# Patient Record
Sex: Female | Born: 1985 | Race: Black or African American | Hispanic: No | Marital: Single
Health system: Midwestern US, Community
[De-identification: ages and names within clinical notes are randomized; demographics above are authoritative.]

## PROBLEM LIST (undated history)

## (undated) DIAGNOSIS — F319 Bipolar disorder, unspecified: Secondary | ICD-10-CM

## (undated) DIAGNOSIS — I10 Essential (primary) hypertension: Secondary | ICD-10-CM

## (undated) DIAGNOSIS — O09299 Supervision of pregnancy with other poor reproductive or obstetric history, unspecified trimester: Secondary | ICD-10-CM

## (undated) DIAGNOSIS — Z3009 Encounter for other general counseling and advice on contraception: Secondary | ICD-10-CM

## (undated) DIAGNOSIS — 1 ERRONEOUS ENCOUNTER ICD10: Secondary | ICD-10-CM

## (undated) DIAGNOSIS — N926 Irregular menstruation, unspecified: Secondary | ICD-10-CM

## (undated) DIAGNOSIS — O0990 Supervision of high risk pregnancy, unspecified, unspecified trimester: Secondary | ICD-10-CM

## (undated) HISTORY — DX: Essential (primary) hypertension: I10

## (undated) HISTORY — DX: Supervision of pregnancy with other poor reproductive or obstetric history, unspecified trimester: O09.299

## (undated) HISTORY — DX: Bipolar disorder, unspecified: F31.9

## (undated) HISTORY — PX: WISDOM TOOTH EXTRACTION: SHX21

---
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
## 2010-03-29 LAB — DRUGS OF ABUSE SCR, URINE
Amphetamine Screen, Ur: NEGATIVE
Barbiturate Screen, Ur: NEGATIVE
Benzodiazepines: NEGATIVE
Cannabinoid Scrn, Ur: NEGATIVE
Cocaine Metabolite, Urine: NEGATIVE
Methadone Screen, Urine: NEGATIVE
Opiates, Urine: NEGATIVE
Phencyclidine, Urine: NEGATIVE
Propoxyphene, Urine: NEGATIVE

## 2010-03-29 LAB — MICROSCOPIC URINALYSIS
RBC, UA: 2 /HPF (ref 0–2)
WBC, UA: 10 /HPF (ref 0–5)

## 2010-03-29 LAB — URINALYSIS
Bilirubin, Urine: NEGATIVE
Glucose, UA: NEGATIVE
Ketones, Urine: NEGATIVE
Nitrite, Urine: NEGATIVE
Protein, UA: NEGATIVE
Specific Gravity, UA: 1.012 (ref 1.005–1.030)
Urobilinogen, Urine: NORMAL
pH, UA: 6.5 (ref 5.0–8.0)

## 2010-03-30 LAB — C. TRACHOMATIS / N. GONORRHOEAE, DNA

## 2010-03-30 LAB — CBC WITH DIFFERENTIAL
Absolute Baso #: 0 10*3/uL (ref 0.0–0.2)
Absolute Eos #: 0 10*3/uL (ref 0.0–0.4)
Absolute Lymph #: 1.4 10*3/uL (ref 1.0–4.8)
Absolute Mono #: 0.5 10*3/uL (ref 0.1–1.2)
Absolute Neut #: 8.2 10*3/uL — ABNORMAL HIGH (ref 1.8–7.7)
Basophils: 0 % (ref 0–2)
Eosinophils %: 0 % — ABNORMAL LOW (ref 1–4)
Hematocrit: 34.4 % — ABNORMAL LOW (ref 36–46)
Hemoglobin: 11.8 g/dL — ABNORMAL LOW (ref 12.0–16.0)
Lymphocytes: 14 % — ABNORMAL LOW (ref 24–44)
MCH: 31 pg (ref 26–34)
MCHC: 34.1 g/dL (ref 31–37)
MCV: 90.7 fL (ref 80–100)
MPV: 9.7 fL (ref 6.0–12.0)
Monocytes: 5 % (ref 2–11)
Platelet Count: 259 10*3/uL (ref 140–450)
RBC: 3.8 m/uL — ABNORMAL LOW (ref 4.0–5.2)
RDW: 15 % (ref 12.5–15.4)
Seg Neutrophils: 81 % — ABNORMAL HIGH (ref 36–66)
WBC: 10.1 10*3/uL (ref 3.5–11.0)

## 2010-03-30 LAB — URINE CULTURE CLEAN CATCH

## 2010-04-05 LAB — SURGICAL PATHOLOGY

## 2010-08-25 LAB — POCT URINE PREGNANCY

## 2010-08-25 MED ORDER — MYNATE 90 PLUS PO TBCR
ORAL_TABLET | Freq: Every day | ORAL | Status: DC
Start: 2010-08-25 — End: 2011-04-20

## 2010-08-25 MED ORDER — ONDANSETRON HCL 4 MG PO TABS
4 MG | ORAL_TABLET | Freq: Three times a day (TID) | ORAL | Status: DC | PRN
Start: 2010-08-25 — End: 2011-04-20

## 2010-08-25 NOTE — Progress Notes (Signed)
Brittney [name]  08/25/2010   Patient's last menstrual period was 06/27/2010.  [redacted]w[redacted]d      CC: Initial Prenatal Visit    Subjective:     Brittney [name] is being seen today for her first obstetrical visit.  This is not a planned pregnancy. She is at [redacted]w[redacted]d  Her obstetrical history is significant for severe depression, history of early preterm delivery, and smokes.      Relationship with FOB: significant other, not living together. Patient does not intend to breast feed. Pregnancy history fully reviewed.  Mother's ethnicity: African American  Father's ethnicity: unknown      Objective:   Blood pressure 110/64, height 5\' 4"  (1.626 m), weight 114 lb (51.71 kg), last menstrual period 06/27/2010.    Past Medical History   Diagnosis Date   ??? History of trichomoniasis 03/29/10   ??? 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 Depression      BIPOLAR, SEVERE DEPRESSION ANXIETY   ??? Mild preeclampsia delivered 05,     ELEVATED BP IN EVERY PREGNANCY     History reviewed. No pertinent past surgical history.  History   Smoking status   ??? Current Everyday Smoker -- 0.5 packs/day for 7 years   Smokeless tobacco   ??? Never Used     History   Alcohol Use No     Current Outpatient Prescriptions   Medication Sig Dispense Refill   ??? Prenatal Vit-DSS-Fe Fum-FA (MYNATE 90 PLUS) TBCR Take 1 tablet by mouth daily.  90 tablet  4   ??? ondansetron (ZOFRAN) 4 MG tablet Take 1 tablet by mouth every 8 hours as needed for Nausea.  30 tablet  1       ALLERGIES:  Review of patient's allergies indicates no known allergies.    Physical Exam Completed-See Epic Navigator           Assessment:      Pregnancy at 8 and 3/7 weeks   Patient Active Problem List   Diagnoses   ??? History of trichomoniasis   ??? Varicosities   ??? Depression   ??? Mild preeclampsia delivered          Plan:      Initial labs drawn.  Prenatal vitamins.  Problem list reviewed and updated.  NT Screen/Quad Testing and MSAFP Single Marker: requested  Role of ultrasound in pregnancy  discussed; fetal survey: requests  Amniocentesis discussed: Undecided  Cultures & Wet Prep Collected  Follow-up in 4 weeks        COUNSELING COMPLETED:  INITIAL OBSTETRICAL VISIT EVALUATION:  The patient was seen full history and physical was completed/reviewed. Cytology was collected for patients over 98 years of age.Cultures were collected. The patient was counseled on office policies and she was counseled on termination of pregnancy in the state of [place].     The patient was counseled on Toxoplasmosis, HIV, Tobacco Abuse, Group Beta Strep Infections, Cystic Fibrosis, Preterm Labor precautions and Sickle Cell disease.    The patient was counseled on the risks of tobacco abuse. Both maternal and fetal. She was instructed to stop smoking if currently using tobacco. Morbidity, mortality, and cessation programs were reviewed. The risks include but are not limited to increased risks of preterm labor, preterm delivery, premature rupture of membranes, intrauterine growth restriction, intrauterine fetal demise and abruptio placenta. Secondary smoke risks were also reviewed. Increases in cancer, respiratory problems, and sudden infant death syndrome were reviewed as well.  The patient was informed of a 2-4% risk of congenital anomalies in the general population. She was also informed that karyotyping is the only way to evaluate the fetus for genetic problems and genetic lethal anomalies. Chorionic villous sampling and amniocentesis were also discussed with morbidity rates in detail. She undecided the procedure.    Route of delivery and counseling on vaginal, operative vaginal, and cesarean sections were completed with the risks of each to both the patient as well as her baby. The possibility of a blood transfusion was discussed as well. The patient was not opposed to receiving a transfusion if needed.    Nuchal translucency/Quad Evaluation and MSAFP single marker testing was reviewed in detail with attention to timing of  testing and their windows. For patients beyond the gestational age for Nuchal translucency evaluation Quad testing was recommended. Timing for the Quad test was reviewed. Benefits of the above testing was reviewed. A second trimester amniocentesis was also made available to the patient. Risks, Benefits and non-invasive alternative testing was reviewed.     The patient was questioned in detail regarding any genetic misnomer history, chromosomal abnormalities, or learning disabilities in  herself, the father of the baby or their families. SHE DENIED ANY HISTORY AS STATED ABOVE: Yes    Upon completion of the visit all questions were answered and the patients follow-up and testing schedule were reviewed.  Prenatal vitamins were given.  Referred to MFM for consult ASAP

## 2010-09-09 ENCOUNTER — Ambulatory Visit: Admit: 2010-09-09 | Disposition: A | Discharge status: Home / Self Care | Attending: Psychiatry | Admitting: Psychiatry

## 2010-09-10 LAB — CBC WITH DIFFERENTIAL
Absolute Eos #: 0.1 10*3/uL (ref 0.0–0.4)
Absolute Lymph #: 2.5 10*3/uL (ref 1.0–4.8)
Absolute Mono #: 0.4 10*3/uL (ref 0.1–1.2)
Absolute Neut #: 3.3 10*3/uL (ref 1.8–7.7)
Basophils Absolute: 0.1 10*3/uL (ref 0.0–0.2)
Basophils: 2 % (ref 0–2)
Eosinophils %: 1 % (ref 1–4)
Hematocrit: 37 % (ref 36–46)
Hemoglobin: 12.9 g/dL (ref 12.0–16.0)
Lymphocytes: 39 % (ref 24–44)
MCH: 29.9 pg (ref 26–34)
MCHC: 34.7 g/dL (ref 31–37)
MCV: 86.2 fL (ref 80–100)
MPV: 9.1 fL (ref 6.0–12.0)
Monocytes: 6 % (ref 2–11)
Platelets: 270 10*3/uL (ref 140–450)
RBC: 4.3 m/uL (ref 4.0–5.2)
RDW: 17.1 % — ABNORMAL HIGH (ref 12.5–15.4)
Seg Neutrophils: 52 % (ref 36–66)
WBC: 6.3 10*3/uL (ref 3.5–11.0)

## 2010-09-10 LAB — BASIC METABOLIC PANEL
Anion Gap: 9 mmol/L (ref 8–16)
BUN: 8 mg/dL (ref 6–20)
CO2: 24 mmol/L (ref 20–31)
Calcium: 9.9 mg/dL (ref 8.6–10.4)
Chloride: 105 mmol/L (ref 98–110)
Creatinine: 0.58 mg/dL (ref 0.4–1.0)
GFR African American: >60 mL/min (ref >60)
GFR Non-African American: >60 mL/min (ref >60)
Glucose: 83 mg/dL (ref 74–106)
Potassium: 3.8 mmol/L (ref 3.5–5.1)
Sodium: 134 mmol/L — ABNORMAL LOW (ref 136–145)

## 2010-09-10 LAB — HCG, SERUM, QUALITATIVE: hCG Qual: POSITIVE — AB

## 2010-10-12 MED ORDER — METRONIDAZOLE 500 MG PO TABS
500 MG | ORAL_TABLET | Freq: Two times a day (BID) | ORAL | Status: AC
Start: 2010-10-12 — End: 2010-10-22

## 2010-10-12 NOTE — Progress Notes (Signed)
Blood pressure 102/58, weight 116 lb (52.617 kg), last menstrual period 06/27/2010, unknown if currently breastfeeding.      The patient was seen and evaluated. There was Positive fetal movements. No contractions or leakage of fluid. Signs and symptoms of preterm labor as well as labor were reviewed.The Nuchal Translucency testing was reviewed and found to be normal. A single marker MSAFP was ordered for a 15-[redacted] week gestational age window. TOP ST OH Reviewed. Dates were reviewed with the patient.  An 18-22 week anatomy ultrasound has been ordered.  The patient will return to the office for her next visit in 4 weeks. See antepartum flow sheet. Encouraged pt. To get all prenatal lab work and keep appointments. Pelvic rest. Flagyl ordered for BV encouraged pt. To pick up RX and take all of medication.

## 2010-10-12 NOTE — Assessment & Plan Note (Signed)
Starts 17-P next week  MFM appointment next week  MFM appointment 11/2010

## 2010-10-12 NOTE — Patient Instructions (Signed)
Please reprint prenatal labs for pt. To get today

## 2010-11-15 MED ORDER — CEPHALEXIN 500 MG PO CAPS
500 MG | ORAL_CAPSULE | Freq: Four times a day (QID) | ORAL | Status: AC
Start: 2010-11-15 — End: 2010-11-25

## 2010-11-15 NOTE — Progress Notes (Signed)
Blood pressure 108/64, weight 122 lb (55.339 kg), last menstrual period 06/27/2010, unknown if currently breastfeeding.    The patient was seen and evaluated. There was positive fetal movements. No contractions or leakage of fluid. Signs and symptoms of preterm labor as well as labor were reviewed.The Nuchal Translucency testing was reviewed and found to be normal A single marker MSAFP was reviewed and found to be pt did not get done. The patients anatomy ultrasound has been completed and reviewed with patient. TOP ST OH Reviewed. A 28 week lab panel was ordered. This includes a (CBC, 1 hr GTT, U/A C&S). The patient is to complete this in the next two to four weeks.    The S/S of Pre-Eclampsia were reviewed with the patient in detail. She is to report any of these if they occur. She currently denies any of these.    The patient is RH pt did not get done  Rhogam Ordered no    The patient was instructed on fetal kick counts and was given a kick sheet to complete every 8 hours. This is to begin at [redacted] weeks gestation. She was instructed that the baby should move at a minimum of ten times within one hour after a meal. The patient was instructed to lay down on her left side twenty minutes after eating and count movements for up to one hour with a target value of ten movements.  She was instructed to notify the office if she did not make that target after two attempts or if after any attempt there was less than four movements.      The patient will return to the office for her next visit in 3 weeks. See antepartum flow sheet.   D/W pt importance of getting labs done & keeping scheduled appointments. Pt given Rx for keflex for clinical UTI. Specimen obtained. Pt to keep appointments- counseled. Pt getting weekly 17 OH progesterone injections

## 2010-11-15 NOTE — Progress Notes (Signed)
Addended by: Oval Linsey on: 11/15/2010 10:24 AM     Modules accepted: Orders

## 2010-12-06 MED ORDER — FAMOTIDINE 20 MG PO TABS
20 MG | ORAL_TABLET | Freq: Every day | ORAL | Status: DC
Start: 2010-12-06 — End: 2011-02-01

## 2010-12-06 NOTE — Progress Notes (Signed)
Addended by: Oval Linsey on: 12/06/2010 12:37 PM     Modules accepted: Orders

## 2010-12-06 NOTE — Progress Notes (Signed)
Addended by: Oval Linsey on: 12/06/2010 01:37 PM     Modules accepted: Orders

## 2010-12-06 NOTE — Progress Notes (Signed)
Blood pressure 114/64, weight 125 lb (56.7 kg), last menstrual period 06/27/2010, unknown if currently breastfeeding.    The patient was seen and evaluated. There was positive fetal movements. No contractions or leakage of fluid. Signs and symptoms of preterm labor as well as labor were reviewed.The Nuchal Translucency testing was reviewed and found to be normal A single marker MSAFP was reviewed and found to be not done counseled pt too late. The patients anatomy ultrasound has been completed and reviewed with patient. TOP ST OH Reviewed. A 28 week lab panel was ordered. This includes a (CBC, 1 hr GTT, U/A C&S). The patient is to complete this in the next two to four weeks.    The S/S of Pre-Eclampsia were reviewed with the patient in detail. She is to report any of these if they occur. She currently denies any of these.    The patient is RH positive Rhogam Ordered no    The patient was instructed on fetal kick counts and was given a kick sheet to complete every 8 hours. This is to begin at [redacted] weeks gestation. She was instructed that the baby should move at a minimum of ten times within one hour after a meal. The patient was instructed to lay down on her left side twenty minutes after eating and count movements for up to one hour with a target value of ten movements.  She was instructed to notify the office if she did not make that target after two attempts or if after any attempt there was less than four movements.      The patient will return to the office for her next visit in 3 weeks. See antepartum flow sheet.   Pt noncompliant with getting labs done. To have done today. Noncompliant with tx for BV. Pt recultured today. Risk of PTL/PTD reviewed. Recheck U/A C&S done.  Pt has follow up at MFM at [facility]

## 2010-12-15 MED ORDER — METRONIDAZOLE 500 MG PO TABS
500 MG | ORAL_TABLET | Freq: Two times a day (BID) | ORAL | Status: AC
Start: 2010-12-15 — End: 2010-12-22

## 2010-12-15 NOTE — Telephone Encounter (Signed)
Pt notified of positive BV on culture and per jackie pt to get flagyl 500mg  sent to pt pharm

## 2011-01-03 NOTE — Progress Notes (Signed)
Blood pressure 110/62, weight 130 lb (58.968 kg), last menstrual period 06/27/2010, unknown if currently breastfeeding.      The patient was seen and evaluated. There was positive fetal movements. No contractions or leakage of fluid. Signs and symptoms of preterm labor as well as labor were reviewed. The S/S of Pre-Eclampsia were reviewed with the patient in detail. She is to report any of these if they occur. She currently denies any of these.    The patient had her 28 week labs ordered.    The patient was instructed on fetal kick counts and was given a kick sheet to complete every 8 hours. She was instructed that the baby should move at a minimum of ten times within one hour after a meal. The patient was instructed to lay down on her left side twenty minutes after eating and count movements for up to one hour with a target value of ten movements.  She was instructed to notify the office if she did not make that target after two attempts or if after any attempt there was less than four movements.    The patient reports that the targets have been made Yes.    Allergic reaction to 17-P  The patient will return to the office for her next visit in 2 weeks. See antepartum flow sheet.

## 2011-02-01 MED ORDER — FAMOTIDINE 20 MG PO TABS
20 MG | ORAL_TABLET | Freq: Two times a day (BID) | ORAL | Status: DC | PRN
Start: 2011-02-01 — End: 2011-04-20

## 2011-02-01 NOTE — Assessment & Plan Note (Signed)
28 week labs ordered today  Encouraged to make all appointments

## 2011-02-01 NOTE — Assessment & Plan Note (Signed)
Pt. States off 17-P was allergic to it.  MFM appointment at [facility] 02/2011

## 2011-02-01 NOTE — Progress Notes (Signed)
Blood pressure 118/68, weight 134 lb (60.782 kg), last menstrual period 06/27/2010, unknown if currently breastfeeding.      The patient was seen and evaluated. There was positive fetal movements. No contractions or leakage of fluid. Signs and symptoms of preterm labor as well as labor were reviewed. The S/S of Pre-Eclampsia were reviewed with the patient in detail. She is to report any of these if they occur. She currently denies any of these.    The patient had her 28 week labs ordered.    The patient was instructed on fetal kick counts and was given a kick sheet to complete every 8 hours. She was instructed that the baby should move at a minimum of ten times within one hour after a meal. The patient was instructed to lay down on her left side twenty minutes after eating and count movements for up to one hour with a target value of ten movements.  She was instructed to notify the office if she did not make that target after two attempts or if after any attempt there was less than four movements.    The patient reports that the targets have been made Yes.  Pt. Spilling glucose in urine, discussed diet and encouraged patient to get 1 hour GTT. Pt states has MFM appointment next week.    The patient will return to the office for her next visit in 2 weeks. See antepartum flow sheet.   Pt complains of heart burn Rx written

## 2011-02-14 LAB — URINALYSIS
Bilirubin Urine: NEGATIVE
Leukocyte Esterase, Urine: NEGATIVE
Nitrite, Urine: NEGATIVE
Protein, UA: NEGATIVE
Specific Gravity, UA: 1.02 (ref 1.000–1.030)
Urine Hgb: NEGATIVE
Urobilinogen, Urine: NORMAL
pH, UA: 7 (ref 5.0–8.0)

## 2011-02-15 LAB — C. TRACHOMATIS / N. GONORRHOEAE, DNA: Specimen: 43616

## 2011-02-15 LAB — URINE CULTURE CLEAN CATCH: Culture: NO GROWTH

## 2011-02-16 NOTE — Progress Notes (Signed)
Addended by: Oval Linsey on: 02/16/2011 11:23 AM     Modules accepted: Orders

## 2011-02-16 NOTE — Progress Notes (Signed)
Last menstrual period 06/27/2010, unknown if currently breastfeeding.      The patient was seen and evaluated. There was positive fetal movements. No contractions or leakage of fluid. Signs and symptoms of preterm labor as well as labor were reviewed. The S/S of Pre-Eclampsia were reviewed with the patient in detail. She is to report any of these if they occur. She currently denies any of these.    The patient had her 28 week labs ordered.    The patient was instructed on fetal kick counts and was given a kick sheet to complete every 8 hours. She was instructed that the baby should move at a minimum of ten times within one hour after a meal. The patient was instructed to lay down on her left side twenty minutes after eating and count movements for up to one hour with a target value of ten movements.  She was instructed to notify the office if she did not make that target after two attempts or if after any attempt there was less than four movements.    The patient reports that the targets have been made Yes.      The patient will return to the office for her next visit in 2 weeks. See antepartum flow sheet.   Pt noncompliant with care. Another 28 week lab slip given

## 2011-02-17 LAB — CULTURE, STREP B SCREEN, VAGINAL/RECTAL: Culture: NEGATIVE

## 2011-03-01 MED ORDER — AZITHROMYCIN 250 MG PO TABS
250 MG | ORAL_TABLET | Freq: Two times a day (BID) | ORAL | Status: AC
Start: 2011-03-01 — End: 2011-03-11

## 2011-03-01 NOTE — Progress Notes (Signed)
Blood pressure 114/62, weight 134 lb (60.782 kg), last menstrual period 06/27/2010, unknown if currently breastfeeding.      The patient was seen and evaluated. There was positive fetal movements. No contractions or leakage of fluid. Signs and symptoms of labor were reviewed.  The S/S of Pre-Eclampsia were reviewed with the patient in detail. She is to report any of these if they occur. She currently denies any of these.    The patient was instructed on fetal kick counts and was given a kick sheet to complete every 8 hours. She was instructed that the baby should move at a minimum of ten times within one hour after a meal. The patient was instructed to lay down on her left side twenty minutes after eating and count movements for up to one hour with a target value of ten movements.  She was instructed to notify the office if she did not make that target after two attempts or if after any attempt there was less than four movements.    The patient reports that the targets have been made Yes.    Allergies:  No Known Allergies  Group Beta Strep collection was completed. No: next visit  Sensitivities for clindamycin and erythromycin were ordered. No      The patient was counseled on the mandatory call ahead policy. She has been instructed to call the office at anytime prior to going into the hospital so the on-call physician may direct her to the appropriate facility for care. Exceptions were reviewed including but not limited to: Decreased fetal movement, vaginal Bleeding or hemorrhage, trauma, readily expectant delivery, or any instance where she feels 911 should be utilized.    The patient will return to the office for her next visit in 1 weeks. See antepartum flow sheet.   Pt appt scheduled with MFM for growth sono. Rx sent for treatment of mycoplasma/ ureaplasma. Partner to get treated and pt instructed to abstain or use condoms until partner treated.   Pt noncompliant with labs. Pt instructed she needs NST/BPPs  twice weekly refused. Increased morbidity mortality d/w pt.

## 2011-03-02 NOTE — Progress Notes (Signed)
Please refer to attached ultrasound report for doctor's evaluation of the clinical information obtained by vital signs, ultrasound, and/or non-stress test along with management recommendation.  (please refer to N:drive for documentation for time-based billing)

## 2011-03-02 NOTE — Patient Instructions (Signed)
Patient advised to follow up with referring physician.

## 2011-03-06 NOTE — Progress Notes (Signed)
Please refer to attached ultrasound report for doctor's evaluation of the clinical information obtained by vital signs, ultrasound, and/or non-stress test along with management recommendation.  (please refer to N:drive for documentation for time-based billing)

## 2011-03-08 NOTE — Progress Notes (Signed)
Blood pressure 118/62, weight 136 lb (61.689 kg), last menstrual period 06/27/2010, unknown if currently breastfeeding.      The patient was seen and evaluated. There was positive fetal movements. No contractions or leakage of fluid. Signs and symptoms of labor were reviewed.  The S/S of Pre-Eclampsia were reviewed with the patient in detail. She is to report any of these if they occur. She currently denies any of these.    The patient was instructed on fetal kick counts and was given a kick sheet to complete every 8 hours. She was instructed that the baby should move at a minimum of ten times within one hour after a meal. The patient was instructed to lay down on her left side twenty minutes after eating and count movements for up to one hour with a target value of ten movements.  She was instructed to notify the office if she did not make that target after two attempts or if after any attempt there was less than four movements.    The patient reports that the targets have been made Yes.    Allergies:  No Known Allergies  Group Beta Strep collection was completed. Yes  Sensitivities for clindamycin and erythromycin were ordered. No      The patient was counseled on the mandatory call ahead policy. She has been instructed to call the office at anytime prior to going into the hospital so the on-call physician may direct her to the appropriate facility for care. Exceptions were reviewed including but not limited to: Decreased fetal movement, vaginal Bleeding or hemorrhage, trauma, readily expectant delivery, or any instance where she feels 911 should be utilized.    The patient will return to the office for her next visit in 1 weeks. See antepartum flow sheet.   REACTIVE NST  CATEGORY 1 TRACING  SEE SCANNED DOCUMENT  RTO 2-4 DAYS FOR A FOLLOW UP APPOINTMENT WITH ANTENATAL TESTING.  Pt sees MFM every week.

## 2011-03-13 NOTE — Progress Notes (Signed)
Please refer to attached ultrasound report for doctor's evaluation of the clinical information obtained by vital signs, ultrasound, and/or non-stress test along with management recommendation.  (please refer to N:drive for documentation for time-based billing)

## 2011-03-13 NOTE — Patient Instructions (Signed)
Pt advised to f/u with referring physician

## 2011-03-16 MED ORDER — AZITHROMYCIN 250 MG PO TABS
250 MG | ORAL_TABLET | Freq: Two times a day (BID) | ORAL | Status: AC
Start: 2011-03-16 — End: 2011-03-26

## 2011-03-16 NOTE — Progress Notes (Signed)
Blood pressure 138/62, weight 132 lb 9.6 oz (60.147 kg), last menstrual period 06/27/2010, unknown if currently breastfeeding.      The patient was seen and evaluated. There was positive fetal movements. No contractions or leakage of fluid. Signs and symptoms of labor were reviewed.  The S/S of Pre-Eclampsia were reviewed with the patient in detail. She is to report any of these if they occur. She currently denies any of these.    The patient was instructed on fetal kick counts and was given a kick sheet to complete every 8 hours. She was instructed that the baby should move at a minimum of ten times within one hour after a meal. The patient was instructed to lay down on her left side twenty minutes after eating and count movements for up to one hour with a target value of ten movements.  She was instructed to notify the office if she did not make that target after two attempts or if after any attempt there was less than four movements.    The patient reports that the targets have been made Yes.    Allergies:  Review of patient's allergies indicates no known allergies.    Group Beta Strep collection was completed. Yes    The patient was found to be GBS: negative    Cervical Exam was:   1/50/0/V/I cm dilated  Pt noncompliant with 28 week labs- importance stressed. Another slip for zithromax given . Pt noncompliant with tx for Ureaplasma/ Mycoplasma- morbidity/ mortality d/w pt    The patient was counseled on the mandatory call ahead policy. She has been instructed to call the office at anytime prior to going into the hospital so the on-call physician may direct her to the appropriate facility for care. Exceptions were reviewed including but not limited to: Decreased fetal movement, vaginal Bleeding or hemorrhage, trauma, readily expectant delivery, or any instance where she feels 911 should be utilized.    The patient will return to the office for her next visit in 1 weeks. See antepartum flow sheet.   REACTIVE  NST  CATEGORY 1 TRACING  SEE SCANNED DOCUMENT  RTO 2-4 DAYS FOR A FOLLOW UP APPOINTMENT WITH ANTENATAL TESTING.  Has follow up with MFM Monday

## 2011-03-22 NOTE — Progress Notes (Signed)
Blood pressure 96/64, weight 136 lb (61.689 kg), last menstrual period 06/27/2010, unknown if currently breastfeeding.      The patient was seen and evaluated. There was positive fetal movements. No contractions or leakage of fluid. Signs and symptoms of labor were reviewed.  The S/S of Pre-Eclampsia were reviewed with the patient in detail. She is to report any of these if they occur. She currently denies any of these.    The patient was instructed on fetal kick counts and was given a kick sheet to complete every 8 hours. She was instructed that the baby should move at a minimum of ten times within one hour after a meal. The patient was instructed to lay down on her left side twenty minutes after eating and count movements for up to one hour with a target value of ten movements.  She was instructed to notify the office if she did not make that target after two attempts or if after any attempt there was less than four movements.    The patient reports that the targets have been made Yes.    Allergies:  Review of patient's allergies indicates no known allergies.    Group Beta Strep collection was completed. Yes    The patient was found to be GBS: negative    Cervical Exam was:   3/90/+1/V/I cm dilated  bpp afv +MVT +TONE +BREATHING  REACTIVE NST  CATEGORY 1 TRACING  SEE SCANNED DOCUMENT  RTO 2-4 DAYS FOR A FOLLOW UP APPOINTMENT WITH ANTENATAL TESTING.    The patient was counseled on the mandatory call ahead policy. She has been instructed to call the office at anytime prior to going into the hospital so the on-call physician may direct her to the appropriate facility for care. Exceptions were reviewed including but not limited to: Decreased fetal movement, vaginal Bleeding or hemorrhage, trauma, readily expectant delivery, or any instance where she feels 911 should be utilized.    The patient will return to the office for her next visit in 2 weeks AFTER DELIVERY. See antepartum flow sheet.   pER mfm DELIVERY  TODAY secondary to decreased AFV/ SGA/ favorable cervix

## 2011-04-20 MED ADMIN — medroxyPROGESTERone (DEPO-PROVERA) injection 150 mg: INTRAMUSCULAR | @ 17:00:00

## 2011-04-20 NOTE — Progress Notes (Signed)
The patient was seen. She has no chief complaints today. She delivered vaginally on 03/21/2012. She is not breast feeding and there is not any signs or symptoms of mastitis.  The patient completed the E.P.D.S. Evaluation form and scored 15.  She does not have any signs or symptoms of post partum depression. She denies any intent to harm others and delusional ideas.   Today her lochia is light she denies any dizziness or shortness of breath.  Her pregnancy was complicated by mild left fetal pyelectasis.  She does admit to having good home support. Pt  States she has heavy periods and wants to go on Depo states usually gets it in the hospital but did not after delivery. States intercourse 2 weeks ago and used protection. UPT today was negative, she states she started period today. She reports the depo does not affect her moods and she takes Celexa, she is making an appointment soon to see her mental health provider. She states she does not want to be pregnant again.      Pulse 92, resp. rate 20, height 5\' 4"  (1.626 m), weight 126 lb (57.153 kg), last menstrual period 06/27/2010, not currently breastfeeding.    Abdomen: Soft and non-tender; good bowel sounds; no guarding, rebound or rigidity; no CVA tenderness bilaterally.    Extremities: No calf tenderness bilaterally. DTR 2/4 bilaterally. No edema.    Perineum: intact    Assessment:  1. SVD (spontaneous vaginal delivery)    2. Heavy periods  3. Desires to start depo provera  4. Mild PP depression but patient feels she is coping denies thoughts of harming self or others  Chief Complaint   Patient presents with   . Postpartum Care     EPDS Score of 15        Plan:  1. Return to the office in  3-4 weeks  2. Signs & Symptoms of mastitis reviewed; notify if occurs  3. Secondary smoke risks reviewed. Increased risks of respiratory problems, Sudden     infant death syndrome, and potential malignancies.  4. Abstinence  5. Family planning counseling and STD counseling  completed  6. Return to office in 2-3 weeks for 6 weeks PP and check  7. Return to office for next depo provera injection in 12 weeks  8. Return to office 08/2011 for next annual

## 2011-04-20 NOTE — Addendum Note (Signed)
Addended by: Fuller Mandril on: 04/20/2011 12:23 PM     Modules accepted: Orders

## 2011-04-20 NOTE — Progress Notes (Signed)
Pt given Depo Provera 150 mg in R del. UPT negative lot #[id number] exp 6/14.

## 2011-07-17 MED ADMIN — medroxyPROGESTERone (DEPO-PROVERA) injection 150 mg: INTRAMUSCULAR | @ 15:00:00

## 2011-07-17 NOTE — Progress Notes (Signed)
Pt given Depo Provera 150 mg IM in L del. UPT negative lot #[id number] exp 12/14.

## 2011-10-09 NOTE — Telephone Encounter (Signed)
Requesting Depo hasn appot 7/10

## 2011-10-10 MED ORDER — MEDROXYPROGESTERONE ACETATE 150 MG/ML IM SUSP
150 MG/ML | INTRAMUSCULAR | Status: DC
Start: 2011-10-10 — End: 2013-01-06

## 2011-10-10 NOTE — Telephone Encounter (Signed)
Pt notified to pick up depo from pharmacy and bring to office for injection. Pt has annual appt 10/11/11. [facility] [address] via [facility] per pt.

## 2011-10-11 LAB — FORWARDED TO:

## 2011-10-11 MED ADMIN — medroxyPROGESTERone (DEPO-PROVERA) injection 150 mg: INTRAMUSCULAR | @ 16:00:00

## 2011-10-11 NOTE — Progress Notes (Signed)
History and Physical  [facility]  [facility] [id number]  [place], [place]  [phone]   Fax [phone]  Brittney [name]  10/11/2011              26 y.o.  Chief Complaint   Patient presents with   ??? Annual Exam       Patient's last menstrual period was 10/08/2011.             Primary Care Physician: No primary provider on file.    The patient was seen and examined. She has possible exposure to STD wants vaginal cultures done has no chief complaint today and is here for her annual exam.  Her bowels are regular. There are no voiding complaints. She denies any bloating.  She has vaginal discharge and was counseled on STD's and the need for barrier contraception.     HPI : Brittney [name] is a 26 y.o. female [phone]    Gyn exam, vaginitis, and possible exposure to STD ( wants vaginal cultures done)  ________________________________________________________________________  Obstetric History    G6   P6   T2   P4   A0   TAB0   SAB0   E0   M0   L5      Name of Baby 1 Not recorded   ???  Outcome Date 12/02/03     GA 34w 0d         ???  Delivery Type Vaginal, Spontaneous Delivery   ???  Apgar at 1 min. Not recorded at 5 min. Not recorded   ???  Living Yes   Name of Baby 2 Not recorded   ???  Outcome Date 07/21/06     GA 36w 0d         ???  Delivery Type Vaginal, Spontaneous Delivery   ???  Apgar at 1 min. Not recorded at 5 min. Not recorded   ???  Living Yes   Name of Baby 3 Not recorded   ???  Outcome Date 08/28/07     GA 36w 0d         ???  Delivery Type Vaginal, Spontaneous Delivery   ???  Apgar at 1 min. Not recorded at 5 min. Not recorded   ???  Living Yes   Name of Baby 4 Not recorded   ???  Outcome Date 06/22/09     GA 38w 0d         ???  Delivery Type Vaginal, Spontaneous Delivery   ???  Apgar at 1 min. Not recorded at 5 min. Not recorded   ???  Living Yes   Name of Baby 5 Not recorded   ???  Outcome Date 03/30/10     GA 22w 0d         ???  Delivery Type Vaginal, Spontaneous Delivery   ???  Apgar at 1 min. Not  recorded at 5 min. Not recorded   ???  Living Neonatal Demise   Name of Baby 6 [name]   ???  Outcome Date 03/22/11     GA 38w 0d         ???  Delivery Type Vaginal, Spontaneous Delivery   ???  Apgar at 1 min. Not recorded at 5 min. Not recorded   ???  Living Yes     Past Medical History   Diagnosis Date   ??? History of trichomoniasis 03/29/10   ??? 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 Depression      BIPOLAR, SEVERE DEPRESSION ANXIETY   ??? Mild preeclampsia delivered 05,     ELEVATED BP IN EVERY PREGNANCY   ??? Left fetal pyelectasis, antepartum 02/14/2011   ??? Mild preeclampsia delivered                                                                    History reviewed. No pertinent past surgical history.  Family History   Problem Relation Age of Onset   ??? Seizures Mother      HEAD TRAMA   ??? Depression Mother    ??? Breast Cancer Neg Hx    ??? Cancer Neg Hx    ??? Colon Cancer Neg Hx    ??? Diabetes Neg Hx    ??? Eclampsia Neg Hx    ??? Hypertension Neg Hx    ??? Ovarian Cancer Neg Hx    ??? Preterm Labor Neg Hx    ??? Spont Abortions Neg Hx    ??? Stroke Neg Hx    ??? Cervical Cancer Neg Hx    ??? Lupus Neg Hx      History     Social History   ??? Marital Status: Single     Spouse Name: N/A     Number of Children: N/A   ??? Years of Education: N/A     Occupational History   ??? Not on file.     Social History Main Topics   ??? Smoking status: Current Everyday Smoker -- 0.50 packs/day for 7 years   ??? Smokeless tobacco: Never Used   ??? Alcohol Use: No   ??? Drug Use: No   ??? Sexually Active: Yes -- Female partner(s)     Other Topics Concern   ??? Not on file     Social History Narrative   ??? No narrative on file       MEDICATIONS:  Current Outpatient Prescriptions   Medication Sig Dispense Refill   ??? OXcarbazepine (TRILEPTAL) 300 MG tablet Take 300 mg by mouth 2 times daily.       ??? medroxyPROGESTERone (DEPO-PROVERA) 150 MG/ML injection Please bring to the office for injection.  1 mL  4     No current facility-administered medications for this visit.        ALLERGIES:  Allergies as of 10/11/2011   ??? (No Known Allergies)     Immunization status: up to date and documented, stated as current, but no records available.      Gynecologic History:  Menarche: 26 yo  Menopause at  yo     Patient's last menstrual period was 10/08/2011.    Sexually Active: Yes    STD History: Yes Trich     Permanent Sterilization: No   Reversible Birth Control: Yes Condoms and Depo        Hormone Replacement Exposure: No      Family History of Breast, Ovarian , Colon or Uterine Cancer: No   If YES see scanned worksheet.    Preventative Health Testing:  Date of Last Pap Smear: 08/2010  Abnormal Pap Smear History:   Colposcopy History:   Date of Last Mammogram:   Date of Last Colonoscopy:   Date of Last Bone Density:  ________________________________________________________________________  REVIEW OF SYSTEMS:    yes   A minimum of an eleven point review of systems was completed.    Review Of Systems (11 point):  Constitutional: No fever, chills or malaise; No weight change or fatigue  Head and Eyes: No vision, Headache, Dizziness or trauma in last 12 months  ENT ROS: No hearing, Tinnitis, sinus or taste problems  Hematological and Lymphatic ROS:No Lymphoma, Von Willebrand's, Hemophillia or Bleeding History  Psych ROS: No Depression, Homicidal thoughts,suicidal thoughts, or anxiety  Breast ROS: No prior breast abnormalities or lumps  Respiratory ROS: No SOB, Pneumoniae,Cough, or Pulmonary Embolism History  Cardiovascular ROS: No Chest Pain with Exertion, Palpitations, Syncope, Edema, Arrhythmia  Gastrointestinal ROS: No Indigestion, Heartburn, Nausea, vomiting, Diarrhea, Constipation,or Bowel Changes; No Bloody Stools or melena  Genito-Urinary ROS: No Dysuria, Hematuria or Nocturia. No Urinary Incontinence or Vaginal Discharge  Musculoskeletal ROS: No Arthralgia, Arthritis,Gout,Osteoporosis or Rheumatism  Neurological ROS: No CVA, Migraines, Epilepsy, Seizure Hx, or Limb  Weakness  Dermatological ROS: No Rash, Itching, Hives, Mole Changes or Cancer                                                                                                                                                                                                                                  PHYSICAL Exam:     Constitutional:  Blood pressure 108/66, pulse 76, resp. rate 20, height 5\' 4"  (1.626 m), weight 124 lb (56.246 kg), last menstrual period 10/08/2011, not currently breastfeeding.    General Appearance:  This  is a well Developed, well Nourished, well groomed female.      Her BMI was reviewed. Nutritional decision making was discussed.    Skin:  There was a Normal Inspection of the skin without rashes or lesions.  There were no rashes.  (Papular, Maculopapular, Hives, Pustular, Macular)     There were no lesions (Ulcers, Erythema, Abn. Appearing Nevi)            Lymphatic:  No Lymph Nodes were Palpable in the neck , axilla or groin.   # Of Lymph Nodes; Location ; Character [Normal]  [Shotty] [Tender] [Enlarged]     Neck and EENT:  The neck was supple. There were no masses   The thyroid was not enlarged and had no masses.  Perrla, EOMI B/L, TMI B/L No Abnormalities.   Throat inspected-No exudates or Masses, Nares Patent No Masses  Respiratory:  The lungs were auscultated and found to be clear. There were no rales, rhonchi or wheezes. There was a good respiratory effort.    Cardiovascular:  The heart was in a regular rate and rhythm. . No S3 or S4. There was no murmur appreciated. Location, grade, and radiation are not applicable.     Extremities:  The patients extremities were without calf tenderness, edema, or varicosities.  There was full range of motion in all four extremities. Pulses in all four extremities were appreciated and are 2/4.    Abdomen:  The abdomen was soft and non-tender. There were good bowel sounds in all quadrants and there was no guarding, rebound or rigidity.  On evaluation  there was no evidence of hepatosplenomegaly and there was no costal vertebral angel tenderness bilaterally.  No hernias were appreciated.     Abdominal Scars:     Psych:  The patient had a normal Orientation to: Time, Place, Person, and Situation  There is no Mood / Affect changes    Breast:  (Chest)  normal appearance, no masses or tenderness, Inspection negative  Self breast exams were reviewed in detail.  Pelvic Exam:  Vulva and vagina appear normal. Bimanual exam reveals normal uterus and adnexa. Some vaginal bleeding noted with odor but no cervical motion tenderness with exam.     Rectal Exam:  exam declined by patient          Musculosk:  Normal Gait and station was noted.  Digits were evaluated without abnormal findings.  Range of motion, stability and strength were evaluated and found to be appropriate for the patients age.              ASSESSMENT:      26 y.o. Annual  1. Routine gynecological examination  PAP SMEAR   2. Vaginitis  C. Trachomatis / N. Gonorrhoeae, DNA Probe, VAGINITIS DNA PROBE          Chief Complaint   Patient presents with   ??? Annual Exam          Past Medical History   Diagnosis Date   ??? History of trichomoniasis 03/29/10   ??? 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 Depression      BIPOLAR, SEVERE DEPRESSION ANXIETY   ??? Mild preeclampsia delivered 05,     ELEVATED BP IN EVERY PREGNANCY   ??? Left fetal pyelectasis, antepartum 02/14/2011   ??? Mild preeclampsia delivered          Patient Active Problem List   Diagnosis   ??? History of trichomoniasis   ??? Varicosities   ??? Depression   ??? Mild preeclampsia delivered   ??? Hx of preterm delivery, currently pregnant   ??? Noncompliant pregnant patient   ??? Left fetal pyelectasis, antepartum   ??? History of trichomoniasis          Hereditary Breast, Ovarian, Colon and Uterine Cancer screening Done.          Tobacco & Secondary smoke risks reviewed; instructed on cessation and avoidance      Counseling Completed:  The patient was seen and counseled  on all forms of birth control both female and female  reversible and non. She is aware that hormonal based birth control may increase her risk of developing a blood clot which may increase her morbidity and or mortality. She was counseled on alternate non hormonal based contraception options.  We discussed that smoking and any hormonal based  contraception may increase the patients risks of developing these life threatening blood clots. All patients are encouraged to stop smoking at the time of contraceptive counseling.  Cessation programs were reviewed.    The patient was instructed to use barrier contraception for sexually transmitted disease prevention.  The patient was also informed of antibiotics decreasing contraceptive efficacy and the need for barrier contraception from the onset of her antibiotic dosing and through a minimum of thirty days from antibiotic cessation.    The life threatening side effect profile was reviewed in detail this includes but is not limited to shortness of breath, chest pain, severe or persistent headaches, or calf pain.  If any of these occur the patient has been instructed to stop using her hormonal based contraception, notify the office, and go to the emergency department or call 911.    The patient denied any personal history of blood clots in her leg, lung, or heart and denied any family history of stroke, TIA, sudden cardiac death < 40 y.o.,pulmonary embolism, or deep venous thrombosis.  The patient was seen and counseled on all sexually transmitted diseases their symptoms and treatments.  Barrier recommendations were made.  The patient was made aware of all testing options available to her.  The patient was informed of the recommended preventative health recommendations.  1. Pap smears every year if there is no dysplasia  2. Mammograms begin every year at 25 yo if no abnormalities are found and no family     History.  3. Bone density studies every 2-3 years. Begin at 25 yo. If no  fracture history or osteoporosis family history.(significant).  4. Colonoscopy begin at 25 yo. Repeat every ten years if negative and no family history.  5. Calcium of 1200-1500 mg/day in split dosing  6. Vitamin D 400-800 IU/day  7. All other preventative health recommendations will be managed by the patients Primary care physician.             PLAN:  Return for Annual, come back today for depo .  Vaginal cultures done call for results  Repeat pap every 1 year if negative.   Mammograms every 1 year. If 25 yo and last mammogram was negative.  Calcium and Vitamin D dosing reviewed.  Colonoscopy screening reviewed as well as onset for bone density testing.  Birth control and barrier recommendations discussed.  STD counseling and prevention reviewed.  Gardisil counseling completed for all patients 9-26 yo.  Routine health maintenance per patients PCP.  Orders Placed This Encounter   Procedures   ??? C. Trachomatis / N. Gonorrhoeae, DNA Probe   ??? VAGINITIS DNA PROBE   ??? PAP SMEAR     Order Specific Question:  Collection Type?     Answer:  ( 2) Thin prep     Order Specific Question:  Diagnostic or Screening?     Answer:  ( 1) Screening     Order Specific Question:  HPV Requested?     Answer:  ( 5) HPV if ASCUS     Order Specific Question:  Present Status     Answer:  N/A     Order Specific Question:  Hysterectomy?     Answer:  N/A     Order Specific Question:  Hormone Replacement?     Answer:  N/A     Order Specific Question:  Birth Control     Answer:  ( 8) Depoprovera     Order Specific Question:  Present Illness  Answer:  (12) No present illness     Order Specific Question:  Specify Illness if other     Answer:  NA     Order Specific Question:  HPV Status?     Answer:  N/A     Order Specific Question:  Clinical History     Answer:  N/A     Order Specific Question:  Specify Clinical History if other     Answer:  NA     Order Specific Question:  Last Menstrual Period     Answer:  10/08/11     Order Specific Question:   Last PAP Smear     Answer:  08/25/10     Order Specific Question:  Prior Abnormal Smear     Answer:  (14) None given     Order Specific Question:  If Prior Abnormal, Give Date     Answer:  n/a     Order Specific Question:  Prior Treatment     Answer:  N/A     Order Specific Question:  Specify Treatment if other     Answer:  n/a     Order Specific Question:  Specimen Source     Answer:  ( 2) Cervical Endocervical     Order Specific Question:  Specify Source if other     Answer:  NA

## 2011-10-11 NOTE — Progress Notes (Signed)
Pt given depo provera 150 mg IM in R del. UPT negative, lot #[id number] exp 9/14.

## 2011-10-11 NOTE — Addendum Note (Signed)
Addended by: Fuller MandrilMEAGHER, MICHELE on: 10/11/2011 12:24 PM     Modules accepted: Orders

## 2011-10-12 LAB — VAGINITIS DNA PROBE
Direct Exam: NEGATIVE
Direct Exam: NEGATIVE
Direct Exam: NEGATIVE

## 2011-10-12 LAB — C. TRACHOMATIS / N. GONORRHOEAE, DNA
C. trachomatis DNA: NEGATIVE
N. gonorrhoeae DNA: NEGATIVE

## 2011-10-16 LAB — GYN CYTOLOGY

## 2012-01-01 MED ADMIN — medroxyPROGESTERone (DEPO-PROVERA) injection 150 mg: INTRAMUSCULAR | @ 21:00:00 | NDC 59762453802

## 2012-01-01 NOTE — Progress Notes (Signed)
Pt given depo 150 mg IM, left del, UPT Negative lot#[id number] exp 3/15

## 2012-03-25 MED ADMIN — medroxyPROGESTERone (DEPO-PROVERA) injection 150 mg: INTRAMUSCULAR | @ 16:00:00 | NDC 59762453802

## 2012-03-25 NOTE — Progress Notes (Signed)
Pt was given Depo Provera 150mg  in the Right deltoid

## 2012-05-24 ENCOUNTER — Encounter

## 2012-05-24 NOTE — Progress Notes (Signed)
Pt called stating she is on depo for over a year now, and get a period every 3 months, she got her period in Dec like normal, then a few weeks ago she had extreme pain, spotting, and nausea. Per Annice Pih patient to get Viann Shove ordered and sent to [facility] lab.

## 2012-05-28 NOTE — Progress Notes (Signed)
Brittney [name]  05/28/2012    Date Of Birth:  [birth year]          The patient was seen today. She is here regarding pelvic pain, nausea and vomiting, and diarrhea. States her partner is unfaithful.Her bowels are regular and she is voiding without difficulty. Pt on depo provera injections.      HPI:  Brittney [name] is a 27 y.o. female [phone] vaginitis and possible exposure to STD      Obstetric History    G6   P6   T2   P4   A0   TAB0   SAB0   E0   M0   L5      Name of Baby 1 Not recorded   ???  Outcome Date 12/02/03     GA 34w 0d         ???  Delivery Type Vaginal, Spontaneous Delivery   ???  Apgar at 1 min. Not recorded at 5 min. Not recorded   ???  Living Yes   Name of Baby 2 Not recorded   ???  Outcome Date 07/21/06     GA 36w 0d         ???  Delivery Type Vaginal, Spontaneous Delivery   ???  Apgar at 1 min. Not recorded at 5 min. Not recorded   ???  Living Yes   Name of Baby 3 Not recorded   ???  Outcome Date 08/28/07     GA 36w 0d         ???  Delivery Type Vaginal, Spontaneous Delivery   ???  Apgar at 1 min. Not recorded at 5 min. Not recorded   ???  Living Yes   Name of Baby 4 Not recorded   ???  Outcome Date 06/22/09     GA 38w 0d         ???  Delivery Type Vaginal, Spontaneous Delivery   ???  Apgar at 1 min. Not recorded at 5 min. Not recorded   ???  Living Yes   Name of Baby 5 Not recorded   ???  Outcome Date 03/30/10     GA 22w 0d         ???  Delivery Type Vaginal, Spontaneous Delivery   ???  Apgar at 1 min. Not recorded at 5 min. Not recorded   ???  Living Neonatal Demise   Name of Baby 6 [name]   ???  Outcome Date 03/22/11     GA 38w 0d         ???  Delivery Type Vaginal, Spontaneous Delivery   ???  Apgar at 1 min. Not recorded at 5 min. Not recorded   ???  Living Yes       Past Medical History   Diagnosis Date   ??? History of trichomoniasis 03/29/10   ??? 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 Depression      BIPOLAR, SEVERE DEPRESSION ANXIETY   ??? Mild preeclampsia delivered 05,     ELEVATED BP IN EVERY PREGNANCY   ??? Left fetal  pyelectasis, antepartum 02/14/2011   ??? Mild preeclampsia delivered        History reviewed. No pertinent past surgical history.    Family History   Problem Relation Age of Onset   ??? Seizures Mother      HEAD TRAMA   ??? Depression Mother    ??? Breast Cancer Neg Hx    ???  Cancer Neg Hx    ??? Colon Cancer Neg Hx    ??? Diabetes Neg Hx    ??? Eclampsia Neg Hx    ??? Hypertension Neg Hx    ??? Ovarian Cancer Neg Hx    ??? Preterm Labor Neg Hx    ??? Spont Abortions Neg Hx    ??? Stroke Neg Hx    ??? Cervical Cancer Neg Hx    ??? Lupus Neg Hx        History     Social History   ??? Marital Status: Single     Spouse Name: N/A     Number of Children: N/A   ??? Years of Education: N/A     Occupational History   ??? Not on file.     Social History Main Topics   ??? Smoking status: Current Every Day Smoker -- 0.50 packs/day for 7 years   ??? Smokeless tobacco: Never Used   ??? Alcohol Use: No   ??? Drug Use: No   ??? Sexually Active: Yes -- Female partner(s)     Other Topics Concern   ??? Not on file     Social History Narrative   ??? No narrative on file         MEDICATIONS:  Current Outpatient Prescriptions on File Prior to Visit   Medication Sig Dispense Refill   ??? QUEtiapine (SEROQUEL) 50 MG tablet Take 50 mg by mouth 2 times daily.       ??? medroxyPROGESTERone (DEPO-PROVERA) 150 MG/ML injection Please bring to the office for injection.  1 mL  4     No current facility-administered medications on file prior to visit.           ALLERGIES:  Allergies as of 05/28/2012   ??? (No Known Allergies)             Blood pressure 106/66, pulse 72, resp. rate 16, height 5\' 4"  (1.626 m), weight 135 lb (61.236 kg), last menstrual period 05/27/2012, not currently breastfeeding.      Abdomen: Soft non-tender; good bowel sounds. No guarding, rebound or rigidity. No CVA tenderness bilaterally.    Extremities: No calf tenderness, DTR 2/4, and No edema bilaterally    Pelvic: Vulva and vagina appear normal. Bimanual exam reveals normal uterus and adnexa. Vaginal spotting and slight odor.  Uncomfortable with exam bilateral, because nauseated.     Diagnostics:      Lab Results:  Results for orders placed during the hospital encounter of 10/11/11   VAGINITIS DNA PROBE       Result Value Range    Specimen        Value: VAGINAL SWAB Performed at [facility] [address] [place],    Specimen  [place] [address] [phone]      Special Requests NOT REPORTED      Direct Exam NEGATIVE for Trichomonas vaginalis      Direct Exam NEGATIVE for Candida sp.      Direct Exam NEGATIVE for Gardnerella vaginalis      Direct Exam        Value: Method of testing is a DNA probe intended for detection and identification of    Direct Exam        Value:  Candida species, Gardnerella vaginalis, and Trichomonas vaginalis nucleic acid    Direct Exam        Value:  in vaginal fluid specimens from patients with symptoms of vaginitis/vaginosis.    Status FINAL 10/11/2011  C. TRACHOMATIS / N. GONORRHOEAE, DNA PROBE       Result Value Range    Source VAGINAL SPECIMEN      Chlamydia, DNA Probe NEGATIVE  NEG    GONORRHEA PROBE NEGATIVE  NEG   FORWARDED TO:       Result Value Range    Forwarded to: PAP TO [place]     GYN CYTOLOGY       Result Value Range    Cytology Report        Value: (NOTE)      [id number]      [facility]      ANATOMIC PATHOLOGY      [address]  [place], [place] [address]      [phone]      Fax: [phone]      GYNECOLOGIC CYTOLOGY REPORT            Patient Name: Brittney [name], Brittney [name]      MR#: [id number]      Specimen #[id number]                  Final Diagnosis      CERVICAL MATERIAL, (THIN PREP VIAL):      Specimen Adequacy:          Satisfactory for evaluation.          - Endocervical/transformation zone component present.      Descriptive Diagnosis:          Negative for intraepithelial lesion or malignancy.      Shift in flora suggestive of bacterial vaginosis.                        D. Goldshmidt, CT(ASCP)      **Electronically Signed  Out**      dga/10/16/2011                  Source:      1: CERVICAL MATERIAL, (THIN PREP VIAL)         Assessment:  1. Vaginitis  VAGINITIS DNA PROBE, C. Trachomatis / N. Gonorrhoeae, DNA Probe, hCG, quantitative, pregnancy   2. Pelvic pain in female  CBC Auto Differential, [place] Non OB Transvaginal, hCG, quantitative, pregnancy     Patient Active Problem List    Diagnosis Date Noted   ??? Mild preeclampsia delivered      Priority: Medium   ??? History of trichomoniasis      Priority: Low   ??? Varicosities      Priority: Low     LEGS, WORSE WHEN PREGNANT     ??? Depression      Priority: Low     BIPOLAR, SEVERE DEPRESSION ANXIETY, Ordered to go to counseling but refuses admission, reports ? suicidal  Attempts in pass, refuses medication.     ??? History of trichomoniasis 03/08/2011       The patient was seen and counseled on all sexually transmitted diseases their symptoms and treatments.  Barrier recommendations were made.  The patient was made aware of all testing options available to her.    PLAN:  Return in about 1 month (around 06/25/2012) for Dr Olevia Bowens .  Vaginal cultures done call for results  Pelvic U/S, U/A and C&S, Quant Bhcg and CBC call for results  Repeat Annual every 1 year  Cervical Cytology Evaluation begins at 26 years old.  If Negative Cytology, Follow-up screening per current guidelines.  Return to the office in 4 weeks.  Counseled on preventative health maintenance follow-up.  Orders Placed This Encounter   Procedures   ??? VAGINITIS DNA PROBE   ??? C. Trachomatis / N. Gonorrhoeae, DNA Probe   ??? [place] Non OB Transvaginal     Begin with transabdominal imaging.     Order Specific Question:  Reason for exam:     Answer:  abnormal bleeding   ??? CBC Auto Differential   ??? hCG, quantitative, pregnancy       Patient was seen with total face to face time of 20 minutes. More than 50% of this visit was counseling and education regarding The primary encounter diagnosis was Vaginitis. A diagnosis of Pelvic pain in female was  also pertinent to this visit. and Abdominal Cramping   as well as  counseling on preventative health maintenance follow-up.

## 2012-05-28 NOTE — Addendum Note (Signed)
Addended by: Fuller MandrilMEAGHER, MICHELE on: 05/28/2012 03:23 PM     Modules accepted: Orders

## 2012-05-29 LAB — MICROSCOPIC URINALYSIS

## 2012-05-29 LAB — VAGINITIS DNA PROBE
Direct Exam: NEGATIVE
Direct Exam: NEGATIVE
Direct Exam: POSITIVE — AB

## 2012-05-29 LAB — URINALYSIS
Glucose, Ur: NEGATIVE
Leukocyte Esterase, Urine: NEGATIVE
Nitrite, Urine: NEGATIVE
Specific Gravity, UA: 1.029 (ref 1.005–1.030)
Urobilinogen, Urine: NORMAL
pH, UA: 6 (ref 5.0–8.0)

## 2012-05-30 LAB — URINE CULTURE CLEAN CATCH: Culture: NO GROWTH

## 2012-05-30 LAB — C. TRACHOMATIS / N. GONORRHOEAE, DNA
C. trachomatis DNA: NEGATIVE
N. gonorrhoeae DNA: POSITIVE — AB

## 2012-05-30 MED ORDER — METRONIDAZOLE 500 MG PO TABS
500 MG | ORAL_TABLET | Freq: Two times a day (BID) | ORAL | Status: AC
Start: 2012-05-30 — End: 2012-06-06

## 2012-05-30 MED ADMIN — cefTRIAXone (ROCEPHIN) injection 250 mg: INTRAMUSCULAR | @ 17:00:00 | NDC 68180061101

## 2012-05-30 NOTE — Telephone Encounter (Signed)
Pt informed of (+) BV culture and recommendation for Flagyl.  Pt advised to abstain from intercourse, no ETOH and barrier contraception recommended.  Rx sent to [facility] [address] via [facility] per pt request.

## 2012-05-30 NOTE — Progress Notes (Addendum)
Pt given rocephin 250 mg IM, left hip.  Lot#[id number] exp3/16    Pt advised to abstain from intercourse, notify partner so partner can be treated.  Pt is scheduled for 06/13/12 to [facility] cultures with Annice Pih.    Office supplied Rocephin, patient did not supply.

## 2012-05-30 NOTE — Telephone Encounter (Signed)
Pt informed of (+) GC result and recommendation of Rocephin.  Pt is to come in today at 12:30.  Pt advised to abstain from intercourse and to notify partner so he can be treated.  Pt is scheduled for reculture 06/13/12 with Annice Pih.

## 2012-05-30 NOTE — Telephone Encounter (Signed)
Message copied by Cyndia Bent on Thu May 30, 2012 11:11 AM  ------       Message from: Melody Comas       Created: Thu May 30, 2012 10:22 AM         Needs Rocephin 250 mg IM x 1 dose       Abstain       Partner needs to be treated       Test of cure after treatment if desires in 2-3 weeks  ------

## 2012-05-30 NOTE — Telephone Encounter (Signed)
Message copied by Cyndia Bent on Thu May 30, 2012  9:54 AM  ------       Message from: Melody Comas       Created: Wed May 29, 2012 11:48 AM         Hold U/A for C&S       Flagyl 500 mg # 14 BID po x 7 days  ------

## 2012-06-13 LAB — VAGINITIS DNA PROBE
Direct Exam: NEGATIVE
Direct Exam: NEGATIVE
Direct Exam: POSITIVE — AB

## 2012-06-13 MED ORDER — AZITHROMYCIN 250 MG PO TABS
250 MG | ORAL_TABLET | Freq: Every day | ORAL | Status: DC
Start: 2012-06-13 — End: 2012-06-13

## 2012-06-13 NOTE — Progress Notes (Signed)
Brittney [name]  06/13/2012    Date Of Birth:  [birth year]          The patient was seen today. She is here regarding vaginal discharge and test of cure for STD treatment. Her bowels are regular and she is voiding without difficulty. Pt reports vaginal bleeding since last depo provera injection thinking of switching birth control.    HPI:  Brittney [name] is a 27 y.o. female [phone] vaginitis      Obstetric History    G6   P6   T2   P4   A0   TAB0   SAB0   E0   M0   L5      Name of Baby 1 Not recorded   ???  Outcome Date 12/02/03     GA 34w 0d         ???  Delivery Type Vaginal, Spontaneous Delivery   ???  Apgar at 1 min. Not recorded at 5 min. Not recorded   ???  Living Yes   Name of Baby 2 Not recorded   ???  Outcome Date 07/21/06     GA 36w 0d         ???  Delivery Type Vaginal, Spontaneous Delivery   ???  Apgar at 1 min. Not recorded at 5 min. Not recorded   ???  Living Yes   Name of Baby 3 Not recorded   ???  Outcome Date 08/28/07     GA 36w 0d         ???  Delivery Type Vaginal, Spontaneous Delivery   ???  Apgar at 1 min. Not recorded at 5 min. Not recorded   ???  Living Yes   Name of Baby 4 Not recorded   ???  Outcome Date 06/22/09     GA 38w 0d         ???  Delivery Type Vaginal, Spontaneous Delivery   ???  Apgar at 1 min. Not recorded at 5 min. Not recorded   ???  Living Yes   Name of Baby 5 Not recorded   ???  Outcome Date 03/30/10     GA 22w 0d         ???  Delivery Type Vaginal, Spontaneous Delivery   ???  Apgar at 1 min. Not recorded at 5 min. Not recorded   ???  Living Neonatal Demise   Name of Baby 6 [name]   ???  Outcome Date 03/22/11     GA 38w 0d         ???  Delivery Type Vaginal, Spontaneous Delivery   ???  Apgar at 1 min. Not recorded at 5 min. Not recorded   ???  Living Yes       Past Medical History   Diagnosis Date   ??? History of trichomoniasis 03/29/10   ??? 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 Depression      BIPOLAR, SEVERE DEPRESSION ANXIETY   ??? Mild preeclampsia delivered 05,     ELEVATED BP IN EVERY PREGNANCY   ??? Left  fetal pyelectasis, antepartum 02/14/2011   ??? Mild preeclampsia delivered    ??? History of gonorrhea 05/30/12     tx'd Rocephin 250 mg IM 05/30/12       History reviewed. No pertinent past surgical history.    Family History   Problem Relation Age of Onset   ??? Seizures Mother      HEAD TRAMA   ???  Depression Mother    ??? Breast Cancer Neg Hx    ??? Cancer Neg Hx    ??? Colon Cancer Neg Hx    ??? Diabetes Neg Hx    ??? Eclampsia Neg Hx    ??? Hypertension Neg Hx    ??? Ovarian Cancer Neg Hx    ??? Preterm Labor Neg Hx    ??? Spont Abortions Neg Hx    ??? Stroke Neg Hx    ??? Cervical Cancer Neg Hx    ??? Lupus Neg Hx        History     Social History   ??? Marital Status: Single     Spouse Name: N/A     Number of Children: N/A   ??? Years of Education: N/A     Occupational History   ??? Not on file.     Social History Main Topics   ??? Smoking status: Current Every Day Smoker -- 0.50 packs/day for 7 years   ??? Smokeless tobacco: Never Used   ??? Alcohol Use: No   ??? Drug Use: No   ??? Sexually Active: Yes -- Female partner(s)     Other Topics Concern   ??? Not on file     Social History Narrative   ??? No narrative on file         MEDICATIONS:  Current Outpatient Prescriptions on File Prior to Visit   Medication Sig Dispense Refill   ??? QUEtiapine (SEROQUEL) 50 MG tablet Take 50 mg by mouth 2 times daily.       ??? medroxyPROGESTERone (DEPO-PROVERA) 150 MG/ML injection Please bring to the office for injection.  1 mL  4     No current facility-administered medications on file prior to visit.           ALLERGIES:  Allergies as of 06/13/2012   ??? (No Known Allergies)             Blood pressure 112/72, pulse 80, resp. rate 16, height 5\' 4"  (1.626 m), weight 136 lb (61.689 kg), last menstrual period 05/27/2012, not currently breastfeeding.      Abdomen: Soft non-tender; good bowel sounds. No guarding, rebound or rigidity. No CVA tenderness bilaterally.    Extremities: No calf tenderness, DTR 2/4, and No edema bilaterally    Pelvic: Vulva and vagina appear normal. Bimanual  exam reveals normal uterus and adnexa. Pt has vaginal spotting    Diagnostics:      Lab Results:  Results for orders placed during the hospital encounter of 05/28/12   VAGINITIS DNA PROBE       Result Value Range    Specimen        Value: VAGINAL SWAB Performed at [facility] [address] [place],    Specimen  [place] [address] [phone]      Special Requests NOT REPORTED      Direct Exam POSITIVE for Gardnerella vaginalis. (*)     Direct Exam NEGATIVE for Trichomonas vaginalis      Direct Exam NEGATIVE for Candida sp.      Direct Exam        Value: Method of testing is a DNA probe intended for detection and identification of    Direct Exam        Value:  Candida species, Gardnerella vaginalis, and Trichomonas vaginalis nucleic acid    Direct Exam        Value:  in vaginal fluid specimens from patients with symptoms of vaginitis/vaginosis.  Status FINAL 05/28/2012     URINE CULTURE CLEAN CATCH       Result Value Range    Specimen        Value: CLEAN CATCH URINE Performed at [facility] [address] [place]    Specimen , [place] [address] [phone]      Special Requests NOT REPORTED      Culture NO SIGNIFICANT GROWTH      Culture        Value: Performed at [facility] [address], [place] [address] [phone]    Status FINAL 05/29/2012     C. TRACHOMATIS / N. GONORRHOEAE, DNA PROBE       Result Value Range    Source VAGINAL SPECIMEN      Chlamydia, DNA Probe NEGATIVE  NEG    GONORRHEA PROBE   (*) NEG    Value: POSITIVE: NEISSERIA GONORRHOEAE DNA detected by nucleic acid amplification.   URINALYSIS       Result Value Range    Color, UA AMBER (*) YEL    Turbidity UA TURBID (*) CLEAR    Glucose, Ur NEGATIVE  NEG    Bilirubin Urine   (*) NEG    Value: Presumptive positive. Unable to confirm due to unavailability of reagent.    Ketones, Urine SMALL (*) NEG    Specific Gravity, UA 1.029  1.005 - 1.030    Urine Hgb LARGE (*) NEG    pH, UA 6.0  5.0 - 8.0    Protein, UA TRACE (*)  NEG    Urobilinogen, Urine Normal  NORM    Nitrite, Urine NEGATIVE  NEG    Leukocyte Esterase, Urine NEGATIVE  NEG    Urinalysis Comments NOT REPORTED     MICROSCOPIC URINALYSIS       Result Value Range    -           WBC, UA None  0 - 5 /HPF    RBC, UA None  0 - 2 /HPF    Casts UA NOT REPORTED  0 - 2 /LPF    Crystals UA NOT REPORTED  NONE /HPF    Epithelial Cells UA None      Renal Epithel, UA NOT REPORTED  0 /HPF    Bacteria, UA MODERATE (*) NONE    Mucus, UA NOT REPORTED  NONE    Trichomonas, UA NOT REPORTED  NONE    Amorphous, UA NOT REPORTED  NONE    Other Observations UA NOT REPORTED  NREQ    Yeast, UA NOT REPORTED  NONE         Assessment:  1. Vaginitis  VAGINITIS DNA PROBE, C. Trachomatis / N. Christ Kick, DNA Probe     Patient Active Problem List    Diagnosis Date Noted   ??? History of trichomoniasis      Priority: Low   ??? Varicosities      Priority: Low     LEGS, WORSE WHEN PREGNANT     ??? Depression      Priority: Low     BIPOLAR, SEVERE DEPRESSION ANXIETY, Ordered to go to counseling but refuses admission, reports ? suicidal  Attempts in pass, refuses medication.     ??? History of gonorrhea        The patient was seen and counseled on all sexually transmitted diseases their symptoms and treatments.  Barrier recommendations were made.  The patient was made aware of all testing options available to her.  PLAN:  No Follow-up on file.  Vaginal cultures done call for results  Repeat Annual every 1 year  Cervical Cytology Evaluation begins at 27 years old.  If Negative Cytology, Follow-up screening per current guidelines.   Return to the office in 10/2012 for annual.  Counseled on preventative health maintenance follow-up.  Orders Placed This Encounter   Procedures   ??? VAGINITIS DNA PROBE   ??? C. Trachomatis / N. Gonorrhoeae, DNA Probe       Patient was seen with total face to face time of 15 minutes. More than 50% of this visit was counseling and education regarding The encounter diagnosis was Vaginitis. and  Vaginitis   as well as  counseling on preventative health maintenance follow-up.

## 2012-06-14 LAB — C. TRACHOMATIS / N. GONORRHOEAE, DNA: C. trachomatis DNA: NEGATIVE

## 2012-06-14 MED ORDER — METRONIDAZOLE 500 MG PO TABS
500 MG | ORAL_TABLET | Freq: Two times a day (BID) | ORAL | Status: AC
Start: 2012-06-14 — End: 2012-06-21

## 2012-06-14 NOTE — Telephone Encounter (Signed)
Pt informed of (+) BV and GC results and recommendations. [facility] [address] [facility] pt. She is aware she needs to come to the office for Rocephin, we do not have any in right now but it is being ordered today. She will call the office on Monday to see if we have it in, note made to contact the pt as soon as the shipment comes in to inform her. Pt advised to abstain from intercourse and to notify partner to be tx'd also.

## 2012-06-14 NOTE — Telephone Encounter (Signed)
Message copied by Fuller MandrilMEAGHER, MICHELE on Fri Jun 14, 2012 11:21 AM  ------       Message from: Melody ComasSTEPHENSON, JACKIE L       Created: Fri Jun 14, 2012  8:59 AM         Flagyl 500 mg # 14 BID po x 7 days  ------

## 2012-06-18 MED ADMIN — medroxyPROGESTERone (DEPO-PROVERA) injection 150 mg: INTRAMUSCULAR | @ 14:00:00 | NDC 59762453802

## 2012-06-18 MED ADMIN — cefTRIAXone (ROCEPHIN) injection 250 mg: INTRAMUSCULAR | @ 14:00:00 | NDC 68180061101

## 2012-06-18 NOTE — Progress Notes (Signed)
Pt given depo provera 150 mg IM in L del. UPT neg, lot #[id number] exp 9/15. Pt also given Rocephin 250 mg IM in R hip, lot #[id number] exp 4/16.

## 2012-07-15 LAB — VAGINITIS DNA PROBE
Direct Exam: NEGATIVE
Direct Exam: NEGATIVE
Direct Exam: NEGATIVE

## 2012-07-15 NOTE — Progress Notes (Signed)
Brittney [name]  07/15/2012    Date Of Birth:  1986/03/10          The patient was seen today. She is here regarding repeat cultures after treatment for STD. Her bowels are regular and she is voiding without difficulty.     HPI:  Brittney [name] is a 27 y.o. female [phone] Hx of STD, test of cure today      Obstetric History    G6   P6   T2   P4   A0   TAB0   SAB0   E0   M0   L5      Name of Baby 1 Not recorded   ???  Outcome Date 12/02/03     GA 34w 0d         ???  Delivery Type Vaginal, Spontaneous Delivery   ???  Apgar at 1 min. Not recorded at 5 min. Not recorded   ???  Living Yes   Name of Baby 2 Not recorded   ???  Outcome Date 07/21/06     GA 36w 0d         ???  Delivery Type Vaginal, Spontaneous Delivery   ???  Apgar at 1 min. Not recorded at 5 min. Not recorded   ???  Living Yes   Name of Baby 3 Not recorded   ???  Outcome Date 08/28/07     GA 36w 0d         ???  Delivery Type Vaginal, Spontaneous Delivery   ???  Apgar at 1 min. Not recorded at 5 min. Not recorded   ???  Living Yes   Name of Baby 4 Not recorded   ???  Outcome Date 06/22/09     GA 38w 0d         ???  Delivery Type Vaginal, Spontaneous Delivery   ???  Apgar at 1 min. Not recorded at 5 min. Not recorded   ???  Living Yes   Name of Baby 5 Not recorded   ???  Outcome Date 03/30/10     GA 22w 0d         ???  Delivery Type Vaginal, Spontaneous Delivery   ???  Apgar at 1 min. Not recorded at 5 min. Not recorded   ???  Living Neonatal Demise   Name of Baby 6 [name]   ???  Outcome Date 03/22/11     GA 38w 0d         ???  Delivery Type Vaginal, Spontaneous Delivery   ???  Apgar at 1 min. Not recorded at 5 min. Not recorded   ???  Living Yes       Past Medical History   Diagnosis Date   ??? History of trichomoniasis 03/29/10   ??? 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 Depression      BIPOLAR, SEVERE DEPRESSION ANXIETY   ??? Mild preeclampsia delivered 05,     ELEVATED BP IN EVERY PREGNANCY   ??? Left fetal pyelectasis, antepartum 02/14/2011   ??? Mild preeclampsia delivered    ??? History of  gonorrhea 05/30/12     tx'd Rocephin 250 mg IM 05/30/12       History reviewed. No pertinent past surgical history.    Family History   Problem Relation Age of Onset   ??? Seizures Mother      HEAD TRAMA   ??? Depression Mother    ??? Breast  Cancer Neg Hx    ??? Cancer Neg Hx    ??? Colon Cancer Neg Hx    ??? Diabetes Neg Hx    ??? Eclampsia Neg Hx    ??? Hypertension Neg Hx    ??? Ovarian Cancer Neg Hx    ??? Preterm Labor Neg Hx    ??? Spont Abortions Neg Hx    ??? Stroke Neg Hx    ??? Cervical Cancer Neg Hx    ??? Lupus Neg Hx        History     Social History   ??? Marital Status: Single     Spouse Name: N/A     Number of Children: N/A   ??? Years of Education: N/A     Occupational History   ??? Not on file.     Social History Main Topics   ??? Smoking status: Current Every Day Smoker -- 0.50 packs/day for 7 years   ??? Smokeless tobacco: Never Used   ??? Alcohol Use: No   ??? Drug Use: No   ??? Sexually Active: Yes -- Female partner(s)     Other Topics Concern   ??? Not on file     Social History Narrative   ??? No narrative on file         MEDICATIONS:  Current Outpatient Prescriptions on File Prior to Visit   Medication Sig Dispense Refill   ??? ARIPiprazole (ABILIFY) 2 MG tablet Take 2 mg by mouth daily.       ??? QUEtiapine (SEROQUEL) 50 MG tablet Take 50 mg by mouth 2 times daily.       ??? medroxyPROGESTERone (DEPO-PROVERA) 150 MG/ML injection Please bring to the office for injection.  1 mL  4     No current facility-administered medications on file prior to visit.           ALLERGIES:  Allergies as of 07/15/2012   ??? (No Known Allergies)             Blood pressure 122/62, pulse 74, resp. rate 20, height 5\' 4"  (1.626 m), weight 134 lb (60.782 kg), not currently breastfeeding.      Abdomen: Soft non-tender; good bowel sounds. No guarding, rebound or rigidity. No CVA tenderness bilaterally.    Extremities: No calf tenderness, DTR 2/4, and No edema bilaterally    Pelvic: Vulva and vagina appear normal. Bimanual exam reveals normal uterus and  adnexa.    Diagnostics:      Lab Results:  Results for orders placed during the hospital encounter of 06/13/12   VAGINITIS DNA PROBE       Result Value Range    Specimen        Value: VAGINAL SWAB Performed at [facility] [address]. [place],    Specimen  [place] [id number] [phone]      Special Requests NOT REPORTED      Direct Exam POSITIVE for Gardnerella vaginalis. (*)     Direct Exam NEGATIVE for Trichomonas vaginalis      Direct Exam NEGATIVE for Candida sp.      Direct Exam        Value: Method of testing is a DNA probe intended for detection and identification of    Direct Exam        Value:  Candida species, Gardnerella vaginalis, and Trichomonas vaginalis nucleic acid    Direct Exam        Value:  in vaginal fluid specimens from patients with  symptoms of vaginitis/vaginosis.    Status FINAL 06/13/2012     C. TRACHOMATIS / N. GONORRHOEAE, DNA PROBE       Result Value Range    Source VAGINAL SPECIMEN      Chlamydia, DNA Probe NEGATIVE  NEG    GONORRHEA PROBE   (*) NEG    Value: Low POSITIVE for NEISSERIA GONORRHOEAE DNA detected by nucleic acid         Assessment:  1. History of gonorrhea  VAGINITIS DNA PROBE    C. Trachomatis / N. Christ Kick, DNA Probe     Patient Active Problem List    Diagnosis Date Noted   ??? History of trichomoniasis      Priority: Low   ??? Varicosities      Priority: Low     LEGS, WORSE WHEN PREGNANT     ??? Depression      Priority: Low     BIPOLAR, SEVERE DEPRESSION ANXIETY, Ordered to go to counseling but refuses admission, reports ? suicidal  Attempts in pass, refuses medication.     ??? History of gonorrhea        The patient was seen and counseled on all sexually transmitted diseases their symptoms and treatments.  Barrier recommendations were made.  The patient was made aware of all testing options available to her.    PLAN:  Return in about 3 months (around 10/14/2012) for Annual.  Vaginal cultures done call for results  Repeat Annual every 1 year  Cervical Cytology  Evaluation begins at 27 years old.  If Negative Cytology, Follow-up screening per current guidelines.   Return to the office in prn.  Counseled on preventative health maintenance follow-up.  Orders Placed This Encounter   Procedures   ??? VAGINITIS DNA PROBE   ??? C. Trachomatis / N. Gonorrhoeae, DNA Probe       Patient was seen with total face to face time of 15 minutes. More than 50% of this visit was counseling and education regarding The encounter diagnosis was History of gonorrhea. and Follow-up   as well as  counseling on preventative health maintenance follow-up.

## 2012-07-16 LAB — C. TRACHOMATIS / N. GONORRHOEAE, DNA
C. trachomatis DNA: NEGATIVE
N. gonorrhoeae DNA: NEGATIVE

## 2012-10-02 ENCOUNTER — Telehealth

## 2012-10-02 NOTE — Telephone Encounter (Signed)
[name] so she can receive depo inj

## 2013-01-06 LAB — FORWARDED TO:

## 2013-01-06 MED ORDER — PRENATAL VITAMINS (DIS) PO TABS
ORAL_TABLET | Freq: Every day | ORAL | Status: DC
Start: 2013-01-06 — End: 2013-07-29

## 2013-01-06 NOTE — Progress Notes (Signed)
Brittney [name]  01/06/2013    Date Of Birth:  25-Feb-1986    01/06/2013   Patient's last menstrual period was 10/31/2012.  [redacted]w[redacted]d  [id number]      Primary Care Physician: . None        CC: Initial Prenatal Visit    Subjective:     Brittney [name] is a 27 y.o. female [phone]     is being seen today for her first obstetrical visit.  This is not a planned pregnancy. She is at [redacted]w[redacted]d  Her obstetrical history is significant for hx of preterm labor and delivery, stillbirth at 22 weeks, hx STD, smokes,  and granmultiparous.      Relationship with FOB: significant other, living together. Patient does intend to breast feed. Pregnancy history fully reviewed.  Mother's ethnicity: African American  Father's ethnicity: African American      Objective:   Blood pressure 104/70, weight 128 lb (58.06 kg), last menstrual period 10/31/2012, not currently breastfeeding.    Obstetric History    G7   P6   T2   P4   A0   TAB0   SAB0   E0   M0   L5       # Outcome Date GA Lbr Len/2nd Weight Sex Delivery Anes PTL Lv   7 Current            6 Term 03/22/11 [redacted]w[redacted]d  5 lb 5 oz (2.41 kg) F Vag-Spont  N Y   5 Preterm 03/30/10 [redacted]w[redacted]d  7.9 oz (0.225 kg) M Vag-Spont  Y ND   4 Term 06/22/09 [redacted]w[redacted]d  6 lb 8 oz (2.948 kg) F Vag-Spont   Y   3 Preterm 08/28/07 [redacted]w[redacted]d  4 lb 1 oz (1.843 kg) F Vag-Spont  Y Y   2 Preterm 07/21/06 [redacted]w[redacted]d  6 lb 5 oz (2.863 kg) M Brittney [name]   1 Preterm 12/02/03 [redacted]w[redacted]d  4 lb 5 oz (1.956 kg) M Vag-Spont   Y          Past Medical History   Diagnosis Date   ??? History of trichomoniasis 03/29/10   ??? 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 Depression      BIPOLAR, SEVERE DEPRESSION ANXIETY   ??? Mild preeclampsia delivered 05,     ELEVATED BP IN EVERY PREGNANCY   ??? Left fetal pyelectasis, antepartum 02/14/2011   ??? Mild preeclampsia delivered    ??? History of gonorrhea 05/30/12     tx'd Rocephin 250 mg IM 05/30/12     History reviewed. No pertinent past surgical history.   History     Social History   ??? Marital Status: Single     Spouse Name:  N/A     Number of Children: N/A   ??? Years of Education: N/A     Occupational History   ??? Not on file.     Social History Main Topics   ??? Smoking status: Current Every Day Smoker -- 0.50 packs/day for 7 years   ??? Smokeless tobacco: Never Used   ??? Alcohol Use: No   ??? Drug Use: No   ??? Sexual Activity:     Partners: Male     Other Topics Concern   ??? Not on file     Social History Narrative     Family History   Problem Relation Age of Onset   ??? Seizures Mother      HEAD TRAMA   ???  Depression Mother    ??? Breast Cancer Neg Hx    ??? Cancer Neg Hx    ??? Colon Cancer Neg Hx    ??? Diabetes Neg Hx    ??? Eclampsia Neg Hx    ??? Hypertension Neg Hx    ??? Ovarian Cancer Neg Hx    ??? Preterm Labor Neg Hx    ??? Spont Abortions Neg Hx    ??? Stroke Neg Hx    ??? Cervical Cancer Neg Hx    ??? Lupus Neg Hx        MEDICATIONS:  Current Outpatient Prescriptions   Medication Sig Dispense Refill   ??? Prenatal Vitamins (DIS) TABS Take 1 tablet by mouth daily.  30 tablet  12     No current facility-administered medications for this visit.           ALLERGIES:  Allergies as of 01/06/2013   ??? (No Known Allergies)           Physical Exam Completed-See Epic Navigator         OMM Structural Component:    Assessment:      Pregnancy at 9 and 4/7 weeks   1. Pregnancy examination or test, negative result  POC Pregnancy Urine Qual    Ultrasound OB less than 14 weeks single or first gestation   2. Supervision of high-risk pregnancy  hCG, quantitative, pregnancy    C. Trachomatis / N. Gonorrhoeae, DNA Probe    GYN Cytology    HIV-1 and HIV-2 Antibodies    Genital Culture    Ambulatory referral to Maternal Fetal    Hepatitis B surface antigen obstetric panel    UA W/REFLEX CULTURE    TSH with Reflex    CBC Auto Differential    Glucose, random    RPR    Prenatal type and screen    Rubella antibody, IgG    Ultrasound OB less than 14 weeks single or first gestation   3. Grand multipara     4. Hx of preterm delivery, currently pregnant     5. Fetal demise, less than 22 weeks              Patient Active Problem List    Diagnosis Date Noted   ??? Grand multipara 01/06/2013     Priority: High   ??? Fetal demise, less than 22 weeks 01/06/2013     Priority: High   ??? History of gonorrhea      Priority: Medium   ??? History of trichomoniasis      Priority: Medium   ??? Varicosities      Priority: Low     LEGS, WORSE WHEN PREGNANT     ??? Depression      Priority: Low     BIPOLAR, SEVERE DEPRESSION ANXIETY, Ordered to go to counseling but refuses admission, reports ? suicidal  Attempts in pass, refuses medication.                      Plan:      Initial labs drawn.  Prenatal vitamins.  Problem list reviewed and updated.  NT Screen/Quad Testing and MSAFP Single Marker: requested  Role of ultrasound in pregnancy discussed; fetal survey: requests  Amniocentesis discussed: Declined  Cultures & Wet Prep Collected  Follow-up in 4 weeks  Repeat Annual every 1 year  Cervical Cytology Evaluation begins at 26 years old.  If Negative Cytology, Follow-up screening per current guidelines.   COUNSELING COMPLETED:  INITIAL OBSTETRICAL VISIT EVALUATION:  The patient was seen full history and physical was completed/reviewed. Cytology was collected for patients over 58 years of age.Cultures were collected. The patient was counseled on office policies and she was counseled on termination of pregnancy in the state of [place].     The patient was counseled on Toxoplasmosis, HIV, Tobacco Abuse, Group Beta Strep Infections, Cystic Fibrosis, Preterm Labor precautions and Sickle Cell disease.    The patient was counseled on the risks of tobacco abuse. Both maternal and fetal. She was instructed to stop smoking if currently using tobacco. Morbidity, mortality, and cessation programs were reviewed. The risks include but are not limited to increased risks of preterm labor, preterm delivery, premature rupture of membranes, intrauterine growth restriction, intrauterine fetal demise and abruptio placenta. Secondary smoke risks were also  reviewed. Increases in cancer, respiratory problems, and sudden infant death syndrome were reviewed as well.    The patient was informed of a 2-4% risk of congenital anomalies in the general population. She was also informed that karyotyping is the only way to evaluate the fetus for genetic problems and genetic lethal anomalies. Chorionic villous sampling, amniocentesis and VeriFi were also discussed with morbidity rates in detail. She declined the procedure.    Route of delivery and counseling on vaginal, operative vaginal, and cesarean sections were completed with the risks of each to both the patient as well as her baby. The possibility of a blood transfusion was discussed as well. The patient was not opposed to receiving a transfusion if needed.    Nuchal translucency/Quad Evaluation and MSAFP single marker testing was reviewed in detail with attention to timing of testing and their windows. For patients beyond the gestational age for Nuchal translucency evaluation Quad testing was recommended. Timing for the Quad test was reviewed. Benefits of the above testing was reviewed. A second trimester amniocentesis was also made available to the patient. Risks, Benefits and non-invasive alternative testing was reviewed.     The literature regarding a questionable link to pitocin augmentation and induction of labor, the assistance of labor contractions and the initiation of contractions to help delivery, have been reviewed with the patient regarding the increased potential of having a newborn with Attention Deficit Hyperactivity Disorder and or Autism. These two disorders and the ramifications of their impact on a child and the family caring for that child has been reviewed with the patient in detail. She was given the risks, benefits and alternatives of the use of this medication. She has agreed to its use in the delivery of her unborn child if needed at the time of delivery, Yes.The patient was questioned in detail  regarding any genetic misnomer history, chromosomal abnormalities, or learning disabilities in  herself, the father of the baby or their families. SHE DENIED ANY HISTORY AS STATED ABOVE: Yes  Upon completion of the visit all questions were answered and the patients follow-up and testing schedule were reviewed.  Prenatal vitamins were given.  T-dap Vaccine recommendations reviewed with the patient. Patient notified of timing of vaccination 27-[redacted] weeks gestation. Patient aware Vaccine is NOT Live. Yes.      Unable to get FHT will order early U/S for dates and viability, patient reports one episode of bleeding but did not want to go to the hospital.

## 2013-01-07 LAB — C. TRACHOMATIS / N. GONORRHOEAE, DNA
C. trachomatis DNA: NEGATIVE
N. gonorrhoeae DNA: NEGATIVE

## 2013-01-09 ENCOUNTER — Encounter

## 2013-01-09 LAB — CULTURE, GENITAL, LIMITED
Culture: NEGATIVE
Culture: NORMAL
Specimen: 43616

## 2013-01-20 LAB — GYN CYTOLOGY

## 2013-01-22 NOTE — Progress Notes (Signed)
Please refer to attached ultrasound report for doctor's evaluation of the clinical information obtained by vital signs, ultrasound, and/or non-stress test along with management recommendation.

## 2013-01-22 NOTE — Patient Instructions (Signed)
Pt to follow up with referring physician

## 2013-02-03 MED ORDER — ONDANSETRON HCL 4 MG PO TABS
4 MG | ORAL_TABLET | Freq: Three times a day (TID) | ORAL | Status: DC | PRN
Start: 2013-02-03 — End: 2013-04-28

## 2013-02-03 NOTE — Progress Notes (Signed)
Brittney [name] is a 27 y.o. female [redacted]w[redacted]d    [phone]    OB History   Gravida Para Term Preterm AB SAB TAB Ectopic Multiple Living   7 6 2 4  0 0 0 0 0 5      # Outcome Date GA Lbr Len/2nd Weight Sex Delivery Anes PTL Lv   7 Current            6 Term 03/22/11 [redacted]w[redacted]d  5 lb 5 oz (2.41 kg) F Vag-Spont  N Y   5 Preterm 03/30/10 [redacted]w[redacted]d  7.9 oz (0.225 kg) M Vag-Spont  Y ND      Comments: lived for 1.5 hrs   4 Term 06/22/09 [redacted]w[redacted]d  6 lb 8 oz (2.948 kg) F Vag-Spont   Y   3 Preterm 08/28/07 [redacted]w[redacted]d  4 lb 1 oz (1.843 kg) F Vag-Spont  Y Y   2 Preterm 07/21/06 [redacted]w[redacted]d  6 lb 5 oz (2.863 kg) M Vag-Spont  Y Y   1 Preterm 12/02/03 [redacted]w[redacted]d  4 lb 5 oz (1.956 kg) M Vag-Spont   Y      Comments: Induced, PreEclampsia                  Blood pressure 110/60, weight 125 lb (56.7 kg), last menstrual period 10/31/2012, not currently breastfeeding.      The patient was seen and evaluated. There was Positive fetal movements. No contractions or leakage of fluid. Signs and symptoms of preterm labor as well as labor were reviewed.The Nuchal Translucency testing was reviewed and found to be normal. A single marker MSAFP was ordered for a 15-[redacted] week gestational age window. TOP ST OH Reviewed. Dates were reviewed with the patient.  An 18-22 week anatomy ultrasound has been ordered.  The patient will return to the office for her next visit in 4 weeks. See antepartum flow sheet.     Patient Active Problem List    Diagnosis Date Noted   ??? History of preterm labor, current pregnancy 01/23/2013     Priority: High     01/23/2013 CL every 1-2 weeks between -32 weeks  17 P to start 16-36 weeks  RTO in 5 weeks per MFM     ??? Grand multipara 01/06/2013     Priority: High   ??? Fetal demise, less than 22 weeks 01/06/2013     Priority: High   ??? Smokes 01/06/2013     Priority: High     Discussed smoking cessation     ??? Non-compliant pregnant patient 02/03/2013     Priority: Medium   ??? History of gonorrhea      Priority: Medium   ??? History of trichomoniasis      Priority:  Medium   ??? Varicosities      Priority: Low     LEGS, WORSE WHEN PREGNANT     ??? Depression      Priority: Low     BIPOLAR, SEVERE DEPRESSION ANXIETY, Ordered to go to counseling but refuses admission, reports ? suicidal  Attempts in pass, refuses medication.     ??? Subchorionic hematoma in first trimester, antepartum 01/23/2013   ??? Pregnancy with other poor obstetric history(V23.49) 01/23/2013       1. History of preterm labor, current pregnancy    2. Grand multipara    3. Smokes    4. Non-compliant pregnant patient

## 2013-03-04 NOTE — Progress Notes (Signed)
Brittney [name] is a 27 y.o. female [redacted]w[redacted]d    [phone]    OB History   Gravida Para Term Preterm AB SAB TAB Ectopic Multiple Living   7 6 2 4  0 0 0 0 0 5      # Outcome Date GA Lbr Len/2nd Weight Sex Delivery Anes PTL Lv   7 Current            6 Term 03/22/11 [redacted]w[redacted]d  5 lb 5 oz (2.41 kg) F Vag-Spont  N Y   5 Preterm 03/30/10 [redacted]w[redacted]d  7.9 oz (0.225 kg) M Vag-Spont  Y ND      Comments: lived for 1.5 hrs   4 Term 06/22/09 [redacted]w[redacted]d  6 lb 8 oz (2.948 kg) F Vag-Spont   Y   3 Preterm 08/28/07 [redacted]w[redacted]d  4 lb 1 oz (1.843 kg) F Vag-Spont  Y Y   2 Preterm 07/21/06 [redacted]w[redacted]d  6 lb 5 oz (2.863 kg) M Jerral Ralph   1 Preterm 12/02/03 [redacted]w[redacted]d  4 lb 5 oz (1.956 kg) M Vag-Spont   Y      Comments: Induced, PreEclampsia                  Blood pressure 98/60, weight 130 lb (58.968 kg), last menstrual period 10/31/2012, not currently breastfeeding.      The patient was seen and evaluated. There was Positive fetal movements. No contractions or leakage of fluid. Signs and symptoms of preterm labor as well as labor were reviewed.The Nuchal Translucency testing was reviewed and found to be normal. A single marker MSAFP was ordered for a 15-[redacted] week gestational age window. TOP ST OH Reviewed. Dates were reviewed with the patient.  An 18-22 week anatomy ultrasound has been ordered.  The patient will return to the office for her next visit in 4 weeks. See antepartum flow sheet.     Patient Active Problem List    Diagnosis Date Noted   ??? History of preterm labor, current pregnancy 01/23/2013     Priority: High     01/23/2013 CL every 1-2 weeks between -32 weeks  17 P to start 16-36 weeks  RTO in 5 weeks per MFM     ??? Grand multipara 01/06/2013     Priority: High   ??? Fetal demise, less than 22 weeks 01/06/2013     Priority: High   ??? Smokes 01/06/2013     Priority: High     Discussed smoking cessation     ??? Non-compliant pregnant patient 02/03/2013     Priority: Medium   ??? History of gonorrhea      Priority: Medium   ??? History of trichomoniasis      Priority:  Medium   ??? Varicosities      Priority: Low     LEGS, WORSE WHEN PREGNANT     ??? Depression      Priority: Low     BIPOLAR, SEVERE DEPRESSION ANXIETY, Ordered to go to counseling but refuses admission, reports ? suicidal  Attempts in pass, refuses medication.     ??? Subchorionic hematoma in first trimester, antepartum 01/23/2013   ??? Pregnancy with other poor obstetric history(V23.49) 01/23/2013       1. History of preterm labor, current pregnancy     2. Grand multipara     3. Smokes     4. Non-compliant pregnant patient     5. HRP (high risk pregnancy)  Glucose tolerance screen 50g    Hemoglobin  A1C     [facility]  [address] [place], [place] [address]  [phone]    DIVA LEMBERGER  03/04/2013  Patient's last menstrual period was 10/31/2012.    INITIAL OBSTETRICAL VISIT EVALUATION:  The patient was seen full history and physical was completed/reviewed. Cytology was collected for patients over 29 years of age.Cultures were collected. The patient was counseled on office policies and she was counseled on termination of pregnancy in the state of [place].     The patient was counseled on Toxoplasmosis, HIV, Tobacco Abuse, Group Beta Strep Infections, Cystic Fibrosis, Preterm Labor precautions and Sickle Cell disease.    The patient was counseled on the risks of tobacco abuse. Both maternal and fetal. She was instructed to stop smoking if currently using tobacco. Morbidity, mortality, and cessation programs were reviewed. The risks include but are not limited to increased risks of preterm labor, preterm delivery, premature rupture of membranes, intrauterine growth restriction, intrauterine fetal demise and abruptio placenta. Secondary smoke risks were also reviewed. Increases in cancer, respiratory problems, and sudden infant death syndrome were reviewed as well.    The patient was informed of a 2-4% risk of congenital anomalies in the general population. She was also informed that  karyotyping is the only way to evaluate the fetus for genetic problems and genetic lethal anomalies. Chorionic villous sampling, amniocentesis and Maternal Genetic Blood Sampling-(VeriFi) were also discussed with morbidity rates in detail. She declined any of the options.    Route of delivery and counseling on vaginal, operative vaginal, and cesarean sections were completed with the risks of each to both the patient as well as her baby. The possibility of a blood transfusion was discussed as well. The patient was not opposed to receiving a transfusion if needed.    Nuchal translucency and MSAFP single marker testing was reviewed in detail with attention to timing of testing and their windows. For patients beyond the gestational age for Nuchal translucency evaluation Quad testing was recommended. Timing for the Quad test was reviewed. Benefits of the above testing was reviewed. A second trimester amniocentesis was also made available to the patient. Risks, Benefits and non-invasive alternative testing was reviewed.     The literature regarding a questionable link to pitocin augmentation and induction of labor, the assistance of labor contractions and the initiation of contractions to help delivery, have been reviewed with the patient regarding the increased potential of having a newborn with Attention Deficit Hyperactivity Disorder and or Autism. These two disorders and the ramifications of their impact on a child and the family caring for that child has been reviewed with the patient in detail. She was given the risks, benefits and alternatives of the use of this medication. She has agreed to its use in the delivery of her unborn child if needed at the time of delivery, Yes.    The patient was questioned in detail regarding any genetic misnomer history, chromosomal abnormalities, or learning disabilities in  herself, the father of the baby or their families. SHE DENIED ANY HISTORY AS STATED ABOVE: Yes    Upon  completion of the visit all questions were answered and the patients follow-up and testing schedule were reviewed.  Prenatal vitamins were given.  Pt given another lab slip for routine prenatal labs with HgA1C/ 1 hour glucola and MSAFP. Importance of compliance stressed to pt. Pt has an appointment with counselor at [facility].  Pt denies suicidal/homicidal/ infancidal ideations

## 2013-03-05 NOTE — Progress Notes (Signed)
Please refer to attached ultrasound report for doctor's evaluation of the clinical information obtained by vital signs, ultrasound, and/or non-stress test along with management recommendation.

## 2013-03-19 NOTE — Progress Notes (Signed)
Please refer to attached ultrasound report for doctor's evaluation of the clinical information obtained by vital signs, ultrasound, and/or non-stress test along with management recommendation.

## 2013-03-19 NOTE — Patient Instructions (Signed)
Pt to follow up with referring physician    Thank you for enrolling in MyChart. Please follow the instructions below to securely access your online medical record. MyChart allows you to send messages to your doctor, view your test results, renew your prescriptions, schedule appointments, and more.     How Do I Sign Up?  1. In your Internet browser, go to [email or web address]  2. Click on the Sign Up Now link in the Sign In box. You will see the New Member Sign Up page.  3. Enter your MyChart Access Code exactly as it appears below. You will not need to use this code after you???ve completed the sign-up process. If you do not sign up before the expiration date, you must request a new code.  MyChart Access Code: [email or web address]  Expires: 05/03/2013 10:20 AM    4. Enter your Social Security Number ([id number]) and Date of Birth (mm/dd/yyyy) as indicated and click Submit. You will be taken to the next sign-up page.  5. Create a MyChart ID. This will be your MyChart login ID and cannot be changed, so think of one that is secure and easy to remember.  6. Create a MyChart password. You can change your password at any time.  7. Enter your Password Reset Question and Answer. This can be used at a later time if you forget your password.   8. Enter your e-mail address. You will receive e-mail notification when new information is available in MyChart.  9. Click Sign Up. You can now view your medical record.     Additional Information  If you have questions, please contact your physician practice where you receive care. Remember, MyChart is NOT to be used for urgent needs. For medical emergencies, dial 911.

## 2013-04-03 NOTE — L&D Delivery Note (Signed)
[facility]  OBSTETRICAL  PHYSICIAN POST-OPERATIVE C-SECTION NOTE:      Patient Name: Brittney [name]  Patient DOB: Oct 08, 1985  Room/Bed: OBR2/OBR2-01  Admission Date/Time: 07/13/2013  8:29 PM  Primary Care Physician: No primary provider on file.  [phone]        Date: 07/15/2013  Time: 5:02 AM        Pre-operative Diagnosis:    Preterm pregnancy <37 weeks, Induced labor, Single fetus or Pregnancy complicated by:   Non reassurring FHR status  Remote from delivery- no cervical change x 5 hours  Fetus non tolerant of pitocin  Patient Active Problem List   Diagnosis   ??? History of trichomoniasis   ??? Varicosities   ??? Depression   ??? History of gonorrhea   ??? Grand multipara   ??? Fetal demise, less than 22 weeks   ??? Smokes   ??? Subchorionic hematoma in first trimester, antepartum   ??? History of preterm labor, current pregnancy   ??? Pregnancy with other poor obstetric history(V23.49)   ??? Non-compliant pregnant patient   ??? Cervical shortening, antepartum condition or complication 03/19/2013 2.81 cm    ??? Circumvallate placenta visualized on 03/05/2013   ??? Unspecified antepartum hemorrhage, antepartum   ??? Circumvallate placenta in second trimester   ??? Feeling pelvic pressure in pregnancy, antepartum   ??? Circumvallate placenta   ??? Poor fetal growth, affecting management of mother, antepartum condition or complication   ??? Cervical shortening, antepartum condition or complication   ??? Oligohydramnios (6.35)   ??? IUGR (11%)             Post-operative Diagnosis:    Living newborn infant(s) or Female  Occiput posterior      Procedures:  1. Cesarean Section- primary Cesarean: Low Cervical, Transverse    2. Abdominal Delivery of a Live Born     female        Surgeon:  Manson PasseyMarie M Latoya Diskin, DO      Assistants:  1. Shela CommonsJ. Roost D.O. PGY3  2. Juline PatchL. Krautkramer D.O. PGY2        Anesthesia:  epidural    Duramorph Utilized: Yes    Estimated blood loss:  400 ML    Fluids:     IV: 800 ml   Blood Products:     Cell Saver: No    Urine Output: 500 ml  (clear)    Drains: no   TYPE:     Complications:  None      Findings/ Delivery Summary:    Living newborn infant(s) or Female    Cephalic  occiput posterior  Other:       Amniotic Fluid was: Other clear but minimal  A Nuchal Cord: was not present x 0  A Spontaneous Cry Was Noted: Yes  The Baby: was suctioned        The Placenta Was Removed:  intact, whole and that the umbilical cord had three vessels noted    cord gasses were obtained and sent to the lab, cord blood was obtained and sent to the lab and Pitocin, 20 milliunits in 1 liter of ringers lactate was administered, wide open, to assist with uterine contraction    The Maternal Adnexa was Visualized. There were not any adnexal masses. Fetus  In occiput posterior presentation. No nuchal cord. Minimal blood in cord    Information for the patient's newborn:  Rollen SoxHayman, Babygirl [[id number]][[id number]]   Delivery Method: C-Section, Low Transverse      Obstetrician: Dr Olevia BowensMorelli  Infant Wt:   Information for the patient's newborn:  Rollen SoxHayman, Babygirl [[id number]][[id number]]           APGARS:     Information for the patient's newborn:  Rollen SoxHayman, Babygirl [[id number]][[id number]]             Labor & Delivery Summary  Rupture Date: 07/14/13  Rupture Time: 2301  Induction: AROM  Augmentation: Oxytocin  Delivery of Placenta Date: 07/15/13  Delivery of Placenta Time: 0433  Placenta: Intact  VBAC: No  Complications: None    Specimen:  Placenta sent to pathology yes     Condition:  infant stable to general nursery and mother stable    Blood Type and Rh: O POSITIVE      Rubella Immunity Status:    Rubella Antibody, IGG   Date Value Range Status   06/19/2013 100.1   Final                  REFERENCE RANGE:  <5.0       NON-REACTIVE (non-immune)  5.0 TO 9.9 EQUIVOCAL  >=10.0     REACTIVE     (immune)        NOTE: NEW REFERENCE RANGE  Performed at [facility] [address] [place], [place] [id number] [phone]           Infant Feeding:    both breast and bottle         All Sponge Counts Were Correct x 3 calls-Prior to  closure of Peritoneum, Fascia, and Skin Layers:  Yes        Attending Attestation: I was present and scrubbed for the entire procedure.    SCIP will be utilized for Antibiotics: ancef   Allergies as of 07/13/2013 - Review Complete 07/13/2013   Allergen Reaction Noted   ??? Other Hives 02/03/2013         EPC's in  Place for DVT prophylaxis            Attending's Name: Manson PasseyMarie M Konnie Noffsinger, DO      Resident Physician Completing Document: Manson PasseyMarie M Jarome Trull, DO    Electronically signed by Manson PasseyMarie M Alyiah Ulloa, DO on 07/15/2013 at 5:02 AM        Full Report Dictated

## 2013-04-07 NOTE — Progress Notes (Signed)
Please refer to attached ultrasound report for doctor's evaluation of the clinical information obtained by vital signs, ultrasound, and/or non-stress test along with management recommendation.

## 2013-04-07 NOTE — Progress Notes (Signed)
Brittney [name] is a 28 y.o. female 533w4d    [phone]    OB History   Gravida Para Term Preterm AB SAB TAB Ectopic Multiple Living   7 6 2 4  0 0 0 0 0 5      # Outcome Date GA Lbr Len/2nd Weight Sex Delivery Anes PTL Lv   7 Current            6 Term 03/22/11 6939w0d  5 lb 5 oz (2.41 kg) F Vag-Spont  N Y   5 Preterm 03/30/10 1952w0d  7.9 oz (0.225 kg) M Vag-Spont  Y ND      Comments: lived for 1.5 hrs   4 Term 06/22/09 6139w0d  6 lb 8 oz (2.948 kg) F Vag-Spont   Y   3 Preterm 08/28/07 775w0d  4 lb 1 oz (1.843 kg) F Vag-Spont  Y Y   2 Preterm 07/21/06 385w0d  6 lb 5 oz (2.863 kg) M Jerral RalphVag-Spont  Y Y   1 Preterm 12/02/03 3449w0d  4 lb 5 oz (1.956 kg) M Vag-Spont   Y      Comments: Induced, PreEclampsia            Blood pressure 102/62, weight 133 lb (60.328 kg), last menstrual period 10/31/2012, not currently breastfeeding.    The patient was seen and evaluated. There was positive fetal movements. No contractions or leakage of fluid. Signs and symptoms of preterm labor as well as labor were reviewed.The Nuchal Translucency testing was reviewed and found to be norma. The patients anatomy ultrasound has been completed and reviewed with patient. TOP ST OH Reviewed.     The S/S of Pre-Eclampsia were reviewed with the patient in detail. She is to report any of these if they occur. She currently denies any of these.    STRONGLY ENCOURAGED PATIENT TO GET PRENATAL LABS DRAWN    The patient was instructed on fetal kick counts and was given a kick sheet to complete every 8 hours. This is to begin at [redacted] weeks gestation. She was instructed that the baby should move at a minimum of ten times within one hour after a meal. The patient was instructed to lay down on her left side twenty minutes after eating and count movements for up to one hour with a target value of ten movements.  She was instructed to notify the office if she did not make that target after two attempts or if after any attempt there was less than four movements.      Patient  Active Problem List    Diagnosis Date Noted   ??? Cervical shortening, antepartum condition or complication 03/19/2013 2.81 cm  03/05/2013     Priority: High     Referral for 17 P pt declined per MFM  CL every 1-2 weeks until 32 weeks  Follow up in 2 weeks 03/19/2013 per MFM     ??? Circumvallate placenta visualized on 03/05/2013 03/05/2013     Priority: High     Growth scans every 3-4 weeks starting at 24 weeks  32 week antenatal testing     ??? History of preterm labor, current pregnancy 01/23/2013     Priority: High     01/23/2013 CL every 1-2 weeks between -32 weeks  17 P to start 16-36 weeks  RTO in 5 weeks per MFM     ??? Grand multipara 01/06/2013     Priority: High   ??? Fetal demise, less than 22 weeks 01/06/2013  Priority: High   ??? Smokes 01/06/2013     Priority: High     Discussed smoking cessation     ??? Non-compliant pregnant patient 02/03/2013     Priority: Medium   ??? History of gonorrhea      Priority: Medium   ??? History of trichomoniasis      Priority: Medium   ??? Varicosities      Priority: Low     LEGS, WORSE WHEN PREGNANT     ??? Depression      Priority: Low     BIPOLAR, SEVERE DEPRESSION ANXIETY, Ordered to go to counseling but refuses admission, reports ? suicidal  Attempts in pass, refuses medication.     ??? Circumvallate placenta in second trimester 03/19/2013   ??? Unspecified antepartum hemorrhage, antepartum 03/05/2013   ??? Subchorionic hematoma in first trimester, antepartum 01/23/2013   ??? Pregnancy with other poor obstetric history(V23.49) 01/23/2013       1. HRP (high risk pregnancy)    2. Other specified fetal and placental problems affecting management of mother, antepartum    3. Cervical shortening, antepartum condition or complication    4. History of preterm labor, current pregnancy    5. Grand multipara    6. Smokes    7. Non-compliant pregnant patient    Patient did not want to see a female physician - she states she only wants to be seen by a female - she was ok with seeing me today only if her  partner was in the room with her.    Stressed the importance of patient getting her labs drawn  Reviewed s/s of miscarriage - pt to call with questions/concerns    The patient will return to the office for her next visit in 4 weeks. See antepartum flow sheet.   Lajean Saver DO

## 2013-04-07 NOTE — Patient Instructions (Signed)
Patient advised to follow up with referring physician.

## 2013-04-18 NOTE — Telephone Encounter (Signed)
[facility]

## 2013-04-28 ENCOUNTER — Encounter

## 2013-04-28 MED ORDER — ONDANSETRON HCL 4 MG PO TABS
4 MG | ORAL_TABLET | Freq: Three times a day (TID) | ORAL | Status: DC | PRN
Start: 2013-04-28 — End: 2013-07-29

## 2013-04-28 MED ORDER — FAMOTIDINE 20 MG PO TABS
20 MG | ORAL_TABLET | Freq: Every day | ORAL | Status: DC
Start: 2013-04-28 — End: 2013-07-29

## 2013-04-28 NOTE — Progress Notes (Signed)
Error

## 2013-04-28 NOTE — Telephone Encounter (Signed)
[facility]

## 2013-04-28 NOTE — Telephone Encounter (Signed)
Pt requesting refill on pepcid and zofran pt having nausea and heartburn

## 2013-05-06 NOTE — Patient Instructions (Signed)
Pt advised to f/u with referring physician

## 2013-05-06 NOTE — Progress Notes (Signed)
Brittney PulseShalonda L Dunlow is a 28 y.o. female 3868w5d    [phone]    OB History   Gravida Para Term Preterm AB SAB TAB Ectopic Multiple Living   7 6 2 4  0 0 0 0 0 5      # Outcome Date GA Lbr Len/2nd Weight Sex Delivery Anes PTL Lv   7 Current            6 Term 03/22/11 8013w0d  5 lb 5 oz (2.41 kg) F Vag-Spont  N Y   5 Preterm 03/30/10 5861w0d  7.9 oz (0.225 kg) M Vag-Spont  Y ND      Comments: lived for 1.5 hrs   4 Term 06/22/09 4313w0d  6 lb 8 oz (2.948 kg) F Vag-Spont   Y   3 Preterm 08/28/07 1475w0d  4 lb 1 oz (1.843 kg) F Vag-Spont  Y Y   2 Preterm 07/21/06 1375w0d  6 lb 5 oz (2.863 kg) M Jerral RalphVag-Spont  Y Y   1 Preterm 12/02/03 4660w0d  4 lb 5 oz (1.956 kg) M Vag-Spont   Y      Comments: Induced, PreEclampsia            Blood pressure 114/62, weight 133 lb (60.328 kg), last menstrual period 10/31/2012, not currently breastfeeding.      The patient was seen and evaluated. There was positive fetal movements. No contractions or leakage of fluid. Signs and symptoms of preterm labor as well as labor were reviewed. The S/S of Pre-Eclampsia were reviewed with the patient in detail. She is to report any of these if they occur. She currently denies any of these.    The patient had her 28 week labs ordered.    T-Dap Vaccine (27-36 weeks): awaiting    The patient was instructed on fetal kick counts and was given a kick sheet to complete every 8 hours. She was instructed that the baby should move at a minimum of ten times within one hour after a meal. The patient was instructed to lay down on her left side twenty minutes after eating and count movements for up to one hour with a target value of ten movements.  She was instructed to notify the office if she did not make that target after two attempts or if after any attempt there was less than four movements.    The patient reports that the targets have been made Yes.    Assessment:  1. Brittney [name] is a 28 y.o. female  2. [id number]  3. 3468w5d    Patient Active Problem List    Diagnosis Date Noted    ??? Cervical shortening, antepartum condition or complication 03/19/2013 2.81 cm  03/05/2013     Priority: High     Referral for 17 P pt declined per MFM  CL every 1-2 weeks until 32 weeks  Follow up in 2 weeks 03/19/2013 per MFM     ??? Circumvallate placenta visualized on 03/05/2013 03/05/2013     Priority: High     Growth scans every 3-4 weeks starting at 24 weeks  32 week antenatal testing     ??? History of preterm labor, current pregnancy 01/23/2013     Priority: High     01/23/2013 CL every 1-2 weeks between -32 weeks  17 P to start 16-36 weeks  RTO in 5 weeks per MFM     ??? Grand multipara 01/06/2013     Priority: High   ??? Fetal demise, less than 22  weeks 01/06/2013     Priority: High   ??? Smokes 01/06/2013     Priority: High     Discussed smoking cessation     ??? Non-compliant pregnant patient 02/03/2013     Priority: Medium   ??? History of gonorrhea      Priority: Medium   ??? History of trichomoniasis      Priority: Medium   ??? Varicosities      Priority: Low     LEGS, WORSE WHEN PREGNANT     ??? Depression      Priority: Low     BIPOLAR, SEVERE DEPRESSION ANXIETY, Ordered to go to counseling but refuses admission, reports ? suicidal  Attempts in pass, refuses medication.     ??? Feeling pelvic pressure in pregnancy, antepartum 04/07/2013   ??? Circumvallate placenta in second trimester 03/19/2013   ??? Unspecified antepartum hemorrhage, antepartum 03/05/2013   ??? Subchorionic hematoma in first trimester, antepartum 01/23/2013   ??? Pregnancy with other poor obstetric history(V23.49) 01/23/2013       1. Other specified fetal and placental problems affecting management of mother, antepartum    2. Cervical shortening, antepartum condition or complication    3. History of preterm labor, current pregnancy    4. Grand multipara    5. Smokes    6. Non-compliant pregnant patient              Plan:  The patient will return to the office for her next visit in 3 weeks. See antepartum flow sheet. Pt counseled on compliance with appts and  labs. Pt has appt with MFM today. Pt states she cannot take 17OH progesterone or suppositories secondary to an allergy. Pt counseled on risks of PTD and sequelae with possible NICU admission and increased morbidity/ mortality of prematurity

## 2013-05-06 NOTE — Progress Notes (Signed)
Please refer to attached ultrasound report for doctor's evaluation of the clinical information obtained by vital signs, ultrasound, and/or non-stress test along with management recommendation.

## 2013-05-23 NOTE — Patient Instructions (Signed)
Patient advised to follow up with referring physician.

## 2013-05-23 NOTE — Progress Notes (Signed)
Please refer to attached ultrasound report for doctor's evaluation of the clinical information obtained by vital signs, ultrasound, and/or non-stress test along with management recommendation.

## 2013-06-04 NOTE — Assessment & Plan Note (Signed)
Encouraged to get all blood work and keep all appointments

## 2013-06-04 NOTE — Progress Notes (Signed)
Brittney PulseShalonda L Markie is a 28 y.o. female 5141w6d    [phone]    OB History   Gravida Para Term Preterm AB SAB TAB Ectopic Multiple Living   7 6 2 4  0 0 0 0 0 5      # Outcome Date GA Lbr Len/2nd Weight Sex Delivery Anes PTL Lv   7 Current            6 Term 03/22/11 5072w0d  5 lb 5 oz (2.41 kg) F Vag-Spont  N Y   5 Preterm 03/30/10 1986w0d  7.9 oz (0.225 kg) M Vag-Spont  Y ND      Comments: lived for 1.5 hrs   4 Term 06/22/09 3572w0d  6 lb 8 oz (2.948 kg) F Vag-Spont   Y   3 Preterm 08/28/07 6880w0d  4 lb 1 oz (1.843 kg) F Vag-Spont  Y Y   2 Preterm 07/21/06 680w0d  6 lb 5 oz (2.863 kg) M Jerral RalphVag-Spont  Y Y   1 Preterm 12/02/03 4427w0d  4 lb 5 oz (1.956 kg) M Vag-Spont   Y      Comments: Induced, PreEclampsia            Blood pressure 106/64, weight 136 lb (61.689 kg), last menstrual period 10/31/2012, not currently breastfeeding.      The patient was seen and evaluated. There was positive fetal movements. No contractions or leakage of fluid. Signs and symptoms of preterm labor as well as labor were reviewed. The S/S of Pre-Eclampsia were reviewed with the patient in detail. She is to report any of these if they occur. She currently denies any of these.    The patient had her 28 week labs ordered.    T-Dap Vaccine (27-36 weeks): declined    The patient was instructed on fetal kick counts and was given a kick sheet to complete every 8 hours. She was instructed that the baby should move at a minimum of ten times within one hour after a meal. The patient was instructed to lay down on her left side twenty minutes after eating and count movements for up to one hour with a target value of ten movements.  She was instructed to notify the office if she did not make that target after two attempts or if after any attempt there was less than four movements.    The patient reports that the targets have been made Yes.    Assessment:  1. Brittney [name] is a 28 y.o. female  2. [id number]  3. 541w6d    Patient Active Problem List    Diagnosis Date Noted    ??? Cervical shortening, antepartum condition or complication 03/19/2013 2.81 cm  03/05/2013     Priority: High     Referral for 17 P pt declined per MFM  CL every 1-2 weeks until 32 weeks  Follow up in 2 weeks 03/19/2013 per MFM     ??? Circumvallate placenta visualized on 03/05/2013 03/05/2013     Priority: High     Growth scans every 3-4 weeks starting at 24 weeks  32 week antenatal testing     ??? Non-compliant pregnant patient 02/03/2013     Priority: High   ??? History of preterm labor, current pregnancy 01/23/2013     Priority: High     01/23/2013 CL every 1-2 weeks between -32 weeks  17 P to start 16-36 weeks  RTO in 5 weeks per MFM     ??? Grand multipara 01/06/2013  Priority: High   ??? Fetal demise, less than 22 weeks 01/06/2013     Priority: High   ??? Smokes 01/06/2013     Priority: High     Discussed smoking cessation     ??? History of gonorrhea      Priority: Medium   ??? History of trichomoniasis      Priority: Medium   ??? Varicosities      Priority: Low     LEGS, WORSE WHEN PREGNANT     ??? Depression      Priority: Low     BIPOLAR, SEVERE DEPRESSION ANXIETY, Ordered to go to counseling but refuses admission, reports ? suicidal  Attempts in pass, refuses medication.     ??? Feeling pelvic pressure in pregnancy, antepartum 04/07/2013   ??? Circumvallate placenta in second trimester 03/19/2013   ??? Unspecified antepartum hemorrhage, antepartum 03/05/2013   ??? Subchorionic hematoma in first trimester, antepartum 01/23/2013   ??? Pregnancy with other poor obstetric history(V23.49) 01/23/2013       1. Other specified fetal and placental problems affecting management of mother, antepartum    2. Cervical shortening, antepartum condition or complication    3. History of preterm labor, current pregnancy    4. Grand multipara    5. Smokes    6. Non-compliant pregnant patient              Plan:  The patient will return to the office for her next visit in 2 weeks. See antepartum flow sheet.   Encouraged patient to get all lab work and  keep all appointment

## 2013-06-06 NOTE — Progress Notes (Signed)
Please refer to attached ultrasound report for doctor's evaluation of the clinical information obtained by vital signs, ultrasound, and/or non-stress test along with management recommendation.

## 2013-06-06 NOTE — Patient Instructions (Signed)
Patient advised to follow up with referring physician.

## 2013-06-19 LAB — CBC WITH AUTO DIFFERENTIAL
Absolute Eos #: 0.1 10*3/uL (ref 0.0–0.4)
Absolute Lymph #: 1.9 10*3/uL (ref 1.0–4.8)
Absolute Mono #: 0.4 10*3/uL (ref 0.1–1.2)
Basophils Absolute: 0.1 10*3/uL (ref 0.0–0.2)
Basophils: 1 % (ref 0–2)
Eosinophils %: 1 % (ref 1–4)
Hematocrit: 32.8 % — ABNORMAL LOW (ref 36–46)
Hemoglobin: 11.4 g/dL — ABNORMAL LOW (ref 12.0–16.0)
Lymphocytes: 28 % (ref 24–44)
MCH: 31.5 pg (ref 26–34)
MCHC: 34.7 g/dL (ref 31–37)
MCV: 90.7 fL (ref 80–100)
MPV: 10.3 fL (ref 6.0–12.0)
Monocytes: 6 % (ref 2–11)
Platelets: 208 10*3/uL (ref 140–450)
RBC: 3.62 m/uL — ABNORMAL LOW (ref 4.0–5.2)
RDW: 13.5 % (ref 12.5–15.4)
Seg Neutrophils: 64 % (ref 36–66)
Segs Absolute: 4.4 10*3/uL (ref 1.8–7.7)
WBC: 6.9 10*3/uL (ref 3.5–11.0)

## 2013-06-19 LAB — UA W/REFLEX CULTURE
Bilirubin Urine: NEGATIVE
Ketones, Urine: NEGATIVE
Leukocyte Esterase, Urine: NEGATIVE
Nitrite, Urine: NEGATIVE
Protein, UA: NEGATIVE
Specific Gravity, UA: 1.02 (ref 1.000–1.030)
Urine Hgb: NEGATIVE
Urobilinogen, Urine: NORMAL
pH, UA: 7.5 (ref 5.0–8.0)

## 2013-06-19 LAB — RUBELLA ANTIBODY, IGG: Rubella Antibody, IgG: 100.1 IU/mL

## 2013-06-19 LAB — HEMOGLOBIN A1C
Estimated Avg Glucose: 88 mg/dL
Hemoglobin A1C: 4.7 % (ref 4.0–6.0)

## 2013-06-19 LAB — TSH WITH REFLEX: TSH: 0.99 mIU/L (ref 0.30–5.00)

## 2013-06-19 LAB — GLUCOSE, RANDOM: Glucose: 75 mg/dL (ref 70–99)

## 2013-06-19 LAB — GLUCOSE TOLERANCE, 1 HOUR: Glucose tolerance screen 50g: 86 mg/dL (ref 70–135)

## 2013-06-19 LAB — T. PALLIDUM AB: T. Pallidum Ab: NONREACTIVE

## 2013-06-19 LAB — PRENATAL TYPE AND SCREEN
ABO/Rh: O POS
Antibody Screen: NEGATIVE

## 2013-06-19 LAB — HEPATITIS B SURFACE ANTIGEN OBSTETRIC PANEL: Hepatitis B Surface Ag: NONREACTIVE

## 2013-06-20 LAB — HIV SCREEN: HIV 1/2 Antibody: NONREACTIVE

## 2013-06-20 NOTE — Progress Notes (Signed)
NST/BPP NOTE    Date: 06/20/2013  Time: 4:32 PM    Brittney [name] is a 28 y.o. [phone] 4410w1d    The patient was seen and examined. She is here today for antenatal testing.  The baby is moving well and she denies any complaints.    INDICATION FOR ANTENATAL TESTING:  Her antenatal testing is being performed because she is high risk and for the following reasons:    Patient Active Problem List   Diagnosis   ??? History of trichomoniasis   ??? Varicosities   ??? Depression   ??? History of gonorrhea   ??? Grand multipara   ??? Fetal demise, less than 22 weeks   ??? Smokes   ??? Subchorionic hematoma in first trimester, antepartum   ??? History of preterm labor, current pregnancy   ??? Pregnancy with other poor obstetric history(V23.49)   ??? Non-compliant pregnant patient   ??? Cervical shortening, antepartum condition or complication 03/19/2013 2.81 cm    ??? Circumvallate placenta visualized on 03/05/2013   ??? Unspecified antepartum hemorrhage, antepartum   ??? Circumvallate placenta in second trimester   ??? Feeling pelvic pressure in pregnancy, antepartum       Filed Vitals:    06/20/13 1623   BP: 125/72   Pulse: 113   Temp: 98.4 ??F (36.9 ??C)   TempSrc: Oral       FHT's are 150  There Is moderate Variability  accels present  decels absent    NST:  The Tracing has been reviewed and is considered reactive.    Biophysical Profile:   Amniotic Fluid Index: 12.76  Tone: Present  Movement: Present  Breathing: Present  Biophysical Score: 8 / 8  Position: Cephalic    Assessment/Plan:  1. Brittney [name] is a 28 y.o. female 3410w1d  2. NST/BPP 10/10         -The patient will continue with her scheduled office appointments          She will continue with her antenatal testing as scheduled          Signs and Symptoms of preterm labor and labor precautions were reviewed.          She was counseled on adequate hydration and proper hygiene prior to discharge          The patient was instructed on fetal kick counts  Discussed with Dr. Olevia BowensMorelli  Pt ok to be  discharged home  Pt to follow up as scheduled  Pt fo see Annice PihJackie on Tuesday  Pt to follow up with MFM July 03, 2013      Patient Active Problem List    Diagnosis Date Noted   ??? Cervical shortening, antepartum condition or complication 03/19/2013 2.81 cm  03/05/2013     Priority: High     Overview Note:     Referral for 17 P pt declined per MFM  CL every 1-2 weeks until 32 weeks  Follow up in 2 weeks 03/19/2013 per MFM     ??? Circumvallate placenta visualized on 03/05/2013 03/05/2013     Priority: High     Overview Note:     Growth scans every 3-4 weeks starting at 24 weeks  32 week antenatal testing     ??? Non-compliant pregnant patient 02/03/2013     Priority: High   ??? History of preterm labor, current pregnancy 01/23/2013     Priority: High     Overview Note:     01/23/2013 CL every 1-2  weeks between -32 weeks  17 P to start 16-36 weeks  RTO in 5 weeks per MFM     ??? Grand multipara 01/06/2013     Priority: High   ??? Fetal demise, less than 22 weeks 01/06/2013     Priority: High   ??? Smokes 01/06/2013     Priority: High     Overview Note:     Discussed smoking cessation     ??? History of gonorrhea      Priority: Medium   ??? History of trichomoniasis      Priority: Medium   ??? Varicosities      Priority: Low     Overview Note:     LEGS, WORSE WHEN PREGNANT     ??? Depression      Priority: Low     Overview Note:     BIPOLAR, SEVERE DEPRESSION ANXIETY, Ordered to go to counseling but refuses admission, reports ? suicidal  Attempts in pass, refuses medication.     ??? Feeling pelvic pressure in pregnancy, antepartum 04/07/2013   ??? Circumvallate placenta in second trimester 03/19/2013   ??? Unspecified antepartum hemorrhage, antepartum 03/05/2013   ??? Subchorionic hematoma in first trimester, antepartum 01/23/2013   ??? Pregnancy with other poor obstetric history(V23.49) 01/23/2013

## 2013-06-24 NOTE — Progress Notes (Signed)
REACTIVE NST  CATEGORY 1 TRACING  SEE SCANNED DOCUMENT  RTO 2-4 DAYS FOR A FOLLOW UP APPOINTMENT WITH ANTENATAL TESTING.   Brittney [name] is a 28 y.o. female 3546w5d    [phone]    OB History   Gravida Para Term Preterm AB SAB TAB Ectopic Multiple Living   7 6 2 4  0 0 0 0 0 5      # Outcome Date GA Lbr Len/2nd Weight Sex Delivery Anes PTL Lv   7 Current            6 Term 03/22/11 1545w0d  5 lb 5 oz (2.41 kg) F Vag-Spont  N Y   5 Preterm 03/30/10 964w0d  7.9 oz (0.225 kg) M Vag-Spont  Y ND      Comments: lived for 1.5 hrs   4 Term 06/22/09 4145w0d  6 lb 8 oz (2.948 kg) F Vag-Spont   Y   3 Preterm 08/28/07 2639w0d  4 lb 1 oz (1.843 kg) F Vag-Spont  Y Y   2 Preterm 07/21/06 7039w0d  6 lb 5 oz (2.863 kg) M Jerral RalphVag-Spont  Y Y   1 Preterm 12/02/03 3411w0d  4 lb 5 oz (1.956 kg) M Vag-Spont   Y      Comments: Induced, PreEclampsia            Blood pressure 122/70, weight 138 lb (62.596 kg), last menstrual period 10/31/2012, not currently breastfeeding.      The patient was seen and evaluated. There was positive fetal movements. No contractions or leakage of fluid. Signs and symptoms of preterm labor as well as labor were reviewed. The S/S of Pre-Eclampsia were reviewed with the patient in detail. She is to report any of these if they occur. She currently denies any of these.    The patient had her 28 week labs completed.    T-Dap Vaccine (27-36 weeks): declined    The patient was instructed on fetal kick counts and was given a kick sheet to complete every 8 hours. She was instructed that the baby should move at a minimum of ten times within one hour after a meal. The patient was instructed to lay down on her left side twenty minutes after eating and count movements for up to one hour with a target value of ten movements.  She was instructed to notify the office if she did not make that target after two attempts or if after any attempt there was less than four movements.    The patient reports that the targets have been made  Yes.    Assessment:  1. Brittney [name] is a 28 y.o. female  2. [id number]  3. 10346w5d    Patient Active Problem List    Diagnosis Date Noted   ??? Cervical shortening, antepartum condition or complication 03/19/2013 2.81 cm  03/05/2013     Priority: High     Referral for 17 P pt declined per MFM  CL every 1-2 weeks until 32 weeks  Follow up in 2 weeks 03/19/2013 per MFM     ??? Circumvallate placenta visualized on 03/05/2013 03/05/2013     Priority: High     Growth scans every 3-4 weeks starting at 24 weeks  32 week antenatal testing     ??? Non-compliant pregnant patient 02/03/2013     Priority: High   ??? History of preterm labor, current pregnancy 01/23/2013     Priority: High     01/23/2013 CL every 1-2 weeks between -32  weeks  17 P to start 16-36 weeks  RTO in 5 weeks per MFM     ??? Grand multipara 01/06/2013     Priority: High   ??? Fetal demise, less than 22 weeks 01/06/2013     Priority: High   ??? Smokes 01/06/2013     Priority: High     Discussed smoking cessation     ??? History of gonorrhea      Priority: Medium   ??? History of trichomoniasis      Priority: Medium   ??? Varicosities      Priority: Low     LEGS, WORSE WHEN PREGNANT     ??? Depression      Priority: Low     BIPOLAR, SEVERE DEPRESSION ANXIETY, Ordered to go to counseling but refuses admission, reports ? suicidal  Attempts in pass, refuses medication.     ??? Feeling pelvic pressure in pregnancy, antepartum 04/07/2013   ??? Circumvallate placenta in second trimester 03/19/2013   ??? Unspecified antepartum hemorrhage, antepartum 03/05/2013   ??? Subchorionic hematoma in first trimester, antepartum 01/23/2013   ??? Pregnancy with other poor obstetric history(V23.49) 01/23/2013       1. Other specified fetal and placental problems affecting management of mother, antepartum     2. Cervical shortening, antepartum condition or complication     3. Non-compliant pregnant patient     4. History of preterm labor, current pregnancy  16109 - PR FETAL NON-STRESS TEST   5. Grand  multipara     6. Smokes     7. Circumvallate placenta visualized on 03/05/2013  59025 - PR FETAL NON-STRESS TEST             Plan:  The patient will return to the office for her next visit in 1 weeks for NST and BPP. OB appt. In 2 weeks See antepartum flow sheet.   Sees MFM 07/04/2013

## 2013-07-01 NOTE — Progress Notes (Signed)
REACTIVE NST  CATEGORY 1 TRACING  SEE SCANNED DOCUMENT  RTO 2-4 DAYS FOR A FOLLOW UP APPOINTMENT WITH ANTENATAL TESTING.   Brittney [name] is a 28 y.o. female [redacted]w[redacted]d    [phone]    OB History   Gravida Para Term Preterm AB SAB TAB Ectopic Multiple Living   7 6 2 4  0 0 0 0 0 5      # Outcome Date GA Lbr Len/2nd Weight Sex Delivery Anes PTL Lv   7 Current            6 Term 03/22/11 [redacted]w[redacted]d  5 lb 5 oz (2.41 kg) F Vag-Spont  N Y   5 Preterm 03/30/10 [redacted]w[redacted]d  7.9 oz (0.225 kg) M Vag-Spont  Y ND      Comments: lived for 1.5 hrs   4 Term 06/22/09 [redacted]w[redacted]d  6 lb 8 oz (2.948 kg) F Vag-Spont   Y   3 Preterm 08/28/07 [redacted]w[redacted]d  4 lb 1 oz (1.843 kg) F Vag-Spont  Y Y   2 Preterm 07/21/06 [redacted]w[redacted]d  6 lb 5 oz (2.863 kg) M Jerral Ralph   1 Preterm 12/02/03 [redacted]w[redacted]d  4 lb 5 oz (1.956 kg) M Vag-Spont   Y      Comments: Induced, PreEclampsia            Blood pressure 108/72, weight 137 lb (62.143 kg), last menstrual period 10/31/2012, not currently breastfeeding.      The patient was seen and evaluated. There was positive fetal movements. No contractions or leakage of fluid. Signs and symptoms of preterm labor as well as labor were reviewed. The S/S of Pre-Eclampsia were reviewed with the patient in detail. She is to report any of these if they occur. She currently denies any of these.    The patient had her 28 week labs completed.    T-Dap Vaccine (27-36 weeks): declined    The patient was instructed on fetal kick counts and was given a kick sheet to complete every 8 hours. She was instructed that the baby should move at a minimum of ten times within one hour after a meal. The patient was instructed to lay down on her left side twenty minutes after eating and count movements for up to one hour with a target value of ten movements.  She was instructed to notify the office if she did not make that target after two attempts or if after any attempt there was less than four movements.    The patient reports that the targets have been made  Yes.    Assessment:  1. Brittney [name] is a 28 y.o. female  2. [id number]  3. [redacted]w[redacted]d    Patient Active Problem List    Diagnosis Date Noted   ??? Cervical shortening, antepartum condition or complication 03/19/2013 2.81 cm  03/05/2013     Priority: High     Referral for 17 P pt declined per MFM  CL every 1-2 weeks until 32 weeks  Follow up in 2 weeks 03/19/2013 per MFM     ??? Circumvallate placenta visualized on 03/05/2013 03/05/2013     Priority: High     Growth scans every 3-4 weeks starting at 24 weeks  32 week antenatal testing     ??? Non-compliant pregnant patient 02/03/2013     Priority: High   ??? History of preterm labor, current pregnancy 01/23/2013     Priority: High     01/23/2013 CL every 1-2 weeks between -32  weeks  17 P to start 16-36 weeks  RTO in 5 weeks per MFM     ??? Grand multipara 01/06/2013     Priority: High   ??? Fetal demise, less than 22 weeks 01/06/2013     Priority: High   ??? Smokes 01/06/2013     Priority: High     Discussed smoking cessation     ??? History of gonorrhea      Priority: Medium   ??? History of trichomoniasis      Priority: Medium   ??? Varicosities      Priority: Low     LEGS, WORSE WHEN PREGNANT     ??? Depression      Priority: Low     BIPOLAR, SEVERE DEPRESSION ANXIETY, Ordered to go to counseling but refuses admission, reports ? suicidal  Attempts in pass, refuses medication.     ??? Feeling pelvic pressure in pregnancy, antepartum 04/07/2013   ??? Circumvallate placenta in second trimester 03/19/2013   ??? Unspecified antepartum hemorrhage, antepartum 03/05/2013   ??? Subchorionic hematoma in first trimester, antepartum 01/23/2013   ??? Pregnancy with other poor obstetric history(V23.49) 01/23/2013       1. HRP (high risk pregnancy)  PR FETAL NON-STRESS TEST   2. Other specified fetal and placental problems affecting management of mother, antepartum  PR FETAL NON-STRESS TEST   3. Cervical shortening, antepartum condition or complication     4. Non-compliant pregnant patient     5. History of  preterm labor, current pregnancy     6. Grand multipara     7. Smokes     8. Circumvallate placenta visualized on 03/05/2013  PR FETAL NON-STRESS TEST             Plan:  The patient will return to the office for her next visit in 1 weeks. See antepartum flow sheet.   Has BPP 07/03/2013 at MFM

## 2013-07-03 NOTE — Patient Instructions (Signed)
Pt advised to f/u with referring physician

## 2013-07-03 NOTE — Telephone Encounter (Signed)
Requesting a call back from Brittney [name]. She went to MFM today and she said every time she goes there there seems to be another issue. [phone]

## 2013-07-03 NOTE — Telephone Encounter (Signed)
Please review

## 2013-07-03 NOTE — Progress Notes (Signed)
 MFM Office Visit:  Start fetal antenatal surveillance protocol with non-stress testing twice weekly and weekly assessment of amniotic fluid volume, biophysical profile, umbilical artery and fetal MCA-PSV doppler waveforms (suboptimal fetal growth with lagging fetal AC).     I would advise delivery between 38 0/7-39 6/7 weeks' gestation (per ACOG Guidelines Committee Opinion 560 April 2013, unless maternal and/or fetal factors dictate delivery prior to this point), such as abnormalities in umbilical artery doppler waveforms, non-reassuring fetal heart tones, deterioration in biophysical profile testing, subjective decrease in the fetal movements, development of maternal medical/obstetric complications, etc.  Delivery prior to this advised if maternal or fetal co-morbidities identified (i.e. preeclampsia, chronic hypertension, oligohydramnios, abnormal dopplers, etc.).     Above findings reviewed today with patient. Patient amenable to above plan of care.

## 2013-07-03 NOTE — Progress Notes (Signed)
Please refer to attached ultrasound report for doctor's evaluation of the clinical information obtained by vital signs, ultrasound, and/or non-stress test along with management recommendation.

## 2013-07-08 ENCOUNTER — Observation Stay
Admit: 2013-07-08 | Disposition: A | Discharge status: Home / Self Care | Attending: Obstetrics & Gynecology | Admitting: Obstetrics & Gynecology

## 2013-07-08 LAB — MICROSCOPIC URINALYSIS
Epithelial Cells UA: 5 /HPF (ref 0–5)
RBC, UA: 0 /HPF (ref 0–2)
WBC, UA: 2 /HPF (ref 0–5)

## 2013-07-08 LAB — URINALYSIS
Bilirubin Urine: NEGATIVE
Ketones, Urine: NEGATIVE
Nitrite, Urine: NEGATIVE
Protein, UA: NEGATIVE
Specific Gravity, UA: 1.013 (ref 1.005–1.030)
Urine Hgb: NEGATIVE
Urobilinogen, Urine: NORMAL
pH, UA: 7 (ref 5.0–8.0)

## 2013-07-08 MED ORDER — CEPHALEXIN 500 MG PO CAPS
500 MG | ORAL_CAPSULE | Freq: Four times a day (QID) | ORAL | Status: DC
Start: 2013-07-08 — End: 2013-07-17

## 2013-07-08 MED ORDER — METRONIDAZOLE 500 MG PO TABS
500 MG | ORAL_TABLET | Freq: Two times a day (BID) | ORAL | Status: AC
Start: 2013-07-08 — End: 2013-07-15

## 2013-07-08 NOTE — H&P (Signed)
OBSTETRICAL HISTORY AND PHYSICAL  Wendall Mola    Date: 07/08/2013       Time: 1:46 PM   Patient Name: Brittney [name]     Patient DOB: [birth year]  Room/Bed: 0705/0705-01    Admission Date/Time: 07/08/2013 12:58 PM      CC: abdominal pressure    HPI: Brittney [name] is a 28 y.o. [id number] at [redacted]w[redacted]d who presents with abdominal and low back pressure. She states that the abdominal pain/pressure and low back pain began last night. She describes it as a pinching pressure that is a 5/10. She is unsure if this is how it felt when she went into labor the last time. She admits to white milky discharge for the last week. She states that she has been drinking a lot of water. She denies any new sexual partners and her last sexual contact was a week ago. complains of occasional contractions, denies loss of fluid, denies vaginal bleeding.  She denies headache, SOB, CP, calf pain and swelling. C/O pressure with urination    Her current pregnancy is complicated by cervical shortening and circumvallate placenta. She follows with MFM.    DATING:  LMP: Patient's last menstrual period was 10/31/2012.  Estimated Date of Delivery: 08/07/13   Based on: LMP    PREGNANCY RISK FACTORS:  Patient Active Problem List   Diagnosis   ??? History of trichomoniasis   ??? Varicosities   ??? Depression   ??? History of gonorrhea   ??? Grand multipara   ??? Fetal demise, less than 22 weeks   ??? Smokes   ??? Subchorionic hematoma in first trimester, antepartum   ??? History of preterm labor, current pregnancy   ??? Pregnancy with other poor obstetric history(V23.49)   ??? Non-compliant pregnant patient   ??? Cervical shortening, antepartum condition or complication 03/19/2013 2.81 cm    ??? Circumvallate placenta visualized on 03/05/2013   ??? Unspecified antepartum hemorrhage, antepartum   ??? Circumvallate placenta in second trimester   ??? Feeling pelvic pressure in pregnancy, antepartum   ??? Circumvallate placenta   ??? Poor fetal growth, affecting management of mother,  antepartum condition or complication       Antenatal Steroids Given In This Pregnancy:  no     REVIEW OF SYSTEMS:    Constitutional: negative fever, negative chills  HEENT: negative visual disturbances, negative headaches  Respiratory: negative dyspnea, negative cough  Cardiovascular: negative chest pain,  negative palpitations  Gastrointestinal: positive abdominal pain, negative RUQ pain, positive N/V, negative diarrhea, negative constipation  Genitourinary: positive dysuria, positive vaginal discharge  Dermatological: negative rash  Hematologic: negative bruising  Immunologic/Lymphatic: negative recent illness, negative recent sick contact  Musculoskeletal: positive back pain, negative myalgias, negative arthralgias  Neurological:  negative dizziness, negative weakness  Behavior/Psych: negative depression, negative anxiety    OBSTETRICAL HISTORY:   Obstetric History    G7   P6   T2   P4   A0   TAB0   SAB0   E0   M0   L5       # Outcome Date GA Lbr Len/2nd Weight Sex Delivery Anes PTL Lv   7 Current            6 Term 03/22/11 [redacted]w[redacted]d  5 lb 5 oz (2.41 kg) F Vag-Spont  N Y   5 Preterm 03/30/10 [redacted]w[redacted]d  7.9 oz (0.225 kg) M Vag-Spont  Y ND   4 Term 06/22/09 [redacted]w[redacted]d  6 lb 8 oz (2.948 kg)  F Vag-Spont   Y   3 Preterm 08/28/07 [redacted]w[redacted]d  4 lb 1 oz (1.843 kg) F Vag-Spont  Y Y   2 Preterm 07/21/06 [redacted]w[redacted]d  6 lb 5 oz (2.863 kg) M Vag-Spont  Y Y   1 Preterm 12/02/03 [redacted]w[redacted]d  4 lb 5 oz (1.956 kg) M Vag-Spont   Y          PAST MEDICAL HISTORY:   has a past medical history of History of trichomoniasis; Varicosities; Depression; Mild preeclampsia delivered; Left fetal pyelectasis, antepartum; Mild preeclampsia delivered; and History of gonorrhea.    PAST SURGICAL HISTORY:   has no past surgical history on file.    ALLERGIES:  is allergic to other.    MEDICATIONS:  Prior to Admission medications    Medication Sig Start Date End Date Taking? Authorizing Provider   Diphenhydramine-APAP, sleep, (TYLENOL PM EXTRA STRENGTH PO) Take  by mouth daily as  needed.    Historical Provider, MD   SEROQUEL XR 50 MG XR tablet  06/02/13   Historical Provider, MD   famotidine (PEPCID) 20 MG tablet Take 1 tablet by mouth daily. 04/28/13   Melody Comas, CNM   ondansetron (ZOFRAN) 4 MG tablet Take 1 tablet by mouth every 8 hours as needed for Nausea. 04/28/13   Melody Comas, CNM   Prenatal Vitamins (DIS) TABS Take 1 tablet by mouth daily. 01/06/13   Melody Comas, CNM       FAMILY HISTORY:  family history includes Depression in her mother; Seizures in her mother. There is no history of Breast Cancer, Cancer, Colon Cancer, Diabetes, Eclampsia, Hypertension, Ovarian Cancer, Preterm Labor, Spont Abortions, Stroke, Cervical Cancer, or Lupus.    SOCIAL HISTORY:   reports that she has been smoking.  She has never used smokeless tobacco. She reports that she does not drink alcohol or use illicit drugs.    VITALS:  Filed Vitals:    07/08/13 1310   BP: 129/77   Pulse: 102   Temp: 98.8 ??F (37.1 ??C)   TempSrc: Oral   Resp: 20       PHYSICAL EXAM:  Fetal Heart Monitor:  Baseline Heart Rate 160, moderate variability, present accelerations, absent variable decelerations- during tracing noted to decrease to 120 with mod var, + acel  Toco: contractions, irregular    General appearance:  no apparent distress, alert and cooperative  Neurologic:  alert, oriented, normal speech, no focal findings or movement disorder noted  Lungs:  No increased work of breathing, good air exchange, clear to auscultation bilaterally, no crackles or wheezing  Heart:  regular rate and rhythm and no murmur    Abdomen:  soft, gravid, non-tender, no right upper quadrant tenderness, no CVA tenderness, uterus non-tender, no signs of abruption and no signs of chorioamnionitis,   Extremities:  no calf tenderness, non-edematous   Pelvic Exam:   Speculum: closed cervix with no bleeding, lesions or discharge  Cervix Check: 1 cm dilated, 0 % effaced, -1 station, posterior position, firm consistency,  Cephalic      DATA:  Membranes Ruptured: No  Valsalva/Pooling: absent  Vaginal Bleeding: absent    Wet prep: Clue cells Present , Trichomonas Absent   KOH:  Yeast or Hyphae Absent   Whiff Test: positive   Nitrazine: negative        PRENATAL LAB RESULTS:   Blood Type/Rh:    ABO/Rh   Date Value Range Status   06/19/2013 O POSITIVE   Final  Antibody Screen:    Antibody Screen   Date Value Range Status   06/19/2013 NEGATIVE   Final      Performed at [facility] [address] [place], [place] [address]   [phone]       Hemoglobin:   Hemoglobin   Date Value Range Status   06/19/2013 11.4* 12.0 - 16.0 g/dL Final     Hematocrit:   Hematocrit   Date Value Range Status   06/19/2013 32.8* 36 - 46 % Final     Platelet Count:   Platelet Count   Date Value Range Status   03/30/2010 259  140 - 450 k/uL Final        Platelets   Date Value Range Status   06/19/2013 208  140 - 450 k/uL Final     Rubella:    Rubella Antibody, IGG   Date Value Range Status   06/19/2013 100.1   Final                  REFERENCE RANGE:  <5.0       NON-REACTIVE (non-immune)  5.0 TO 9.9 EQUIVOCAL  >=10.0     REACTIVE     (immune)        NOTE: NEW REFERENCE RANGE  Performed at [facility] [address] [address] [phone]       T. Pallidium, IGG:    No results found for this basename: trepg     Sickle Cell Screen:   No results found for this basename: sickle     Hepatitis B Surface Antigen:   Hepatitis B Surface Ag   Date Value Range Status   06/19/2013 NONREACTIVE  NR Final      Performed at [facility] [address], [place] [address] [phone]     HIV:    HIV 1/2 Antibody   Date Value Range Status   06/19/2013 NONREACTIVE  NR Final                    Interpretation:         The presence of antibody to HIV and its association with the potential   infectivity, transmission or diagnosis of AIDS has not been established.  Furthermore, a 'Non-Reactive' test result does not exclude the possibility of   exposure to or  infection with HIV.  If the above test result is 'Reactive', the Laboratory will order the   confirmatory test.  Performed at [facility] [address] [place], [place] [address] [phone]          Blood Type/Rh: O pos  Antibody Screen: negative  Hemoglobin, Hematocrit, Platelets: 11.4 / 32.8 / 208  Rubella: immune  T. Pallidum, IgG: non reactive    Hepatitis B Surface Antigen: non-reactive   HIV: non-reactive  Sickle Cell Screen: not available  Gonorrhea: negative  Chlamydia: negative  1 hour Glucose Tolerance Test: 86  Group B Strep: not done  Cystic Fibrosis Screen: not available  First Trimester Screen: normal  MSAFP/Multiple Markers: not available  Non-Invasive Prenatal Testing: not available  Anatomy US: Normal, circumvallate placenta     Assessment/Plan:    Brittney [name] is a 28 y.o. female [id number] at [redacted]w[redacted]d with abdominal and back pressure   - GBS unknown / Rh positive / R immune   - Circumvallate placenta    - UA to check for cystitis    - Vaginal probe - BV    Flagyl   - Symptoms  and UTI suspicious    Keflex given   FHT noted to be 120 at point in tracing for aprox 3 min.  DW Dr. Olevia Bowens, plan to monitor for 2 additional hours, pt states that she did not have time for monitoring and chose to leave AMA.  Advised that leaving AMA could result in fetal death and Physician advised staying for further evaluation    Patient Active Problem List    Diagnosis Date Noted   ??? Cervical shortening, antepartum condition or complication 03/19/2013 2.81 cm  03/05/2013     Priority: High     Referral for 17 P pt declined per MFM  CL every 1-2 weeks until 32 weeks  Follow up in 2 weeks 03/19/2013 per MFM     ??? Circumvallate placenta visualized on 03/05/2013 03/05/2013     Priority: High     Growth scans every 3-4 weeks starting at 24 weeks  32 week antenatal testing     ??? Non-compliant pregnant patient 02/03/2013     Priority: High   ??? History of preterm labor, current pregnancy 01/23/2013     Priority: High      01/23/2013 CL every 1-2 weeks between -32 weeks  17 P to start 16-36 weeks  RTO in 5 weeks per MFM     ??? Grand multipara 01/06/2013     Priority: High   ??? Fetal demise, less than 22 weeks 01/06/2013     Priority: High   ??? Smokes 01/06/2013     Priority: High     Discussed smoking cessation     ??? History of gonorrhea      Priority: Medium   ??? History of trichomoniasis      Priority: Medium   ??? Varicosities      Priority: Low     LEGS, WORSE WHEN PREGNANT     ??? Depression      Priority: Low     BIPOLAR, SEVERE DEPRESSION ANXIETY, Ordered to go to counseling but refuses admission, reports ? suicidal  Attempts in pass, refuses medication.     ??? Circumvallate placenta 07/03/2013   ??? Poor fetal growth, affecting management of mother, antepartum condition or complication 07/03/2013   ??? Feeling pelvic pressure in pregnancy, antepartum 04/07/2013   ??? Circumvallate placenta in second trimester 03/19/2013   ??? Unspecified antepartum hemorrhage, antepartum 03/05/2013   ??? Subchorionic hematoma in first trimester, antepartum 01/23/2013   ??? Pregnancy with other poor obstetric history(V23.49) 01/23/2013       Plan discussed with Dr. Olevia Bowens, who is agreeable.     Steroids given this admission: No    Risks, benefits, alternatives and possible complications have been discussed in detail with the patient. Admission, and post admission procedures and expectations were discussed in detail. All questions were answered.    Pt left AMA    Attending's Name: Dr. Rikki Spearing, DO  Ob/Gyn Resident  07/08/2013, 1:46 PM

## 2013-07-08 NOTE — Other (Signed)
Dr. Jimmey RalphParker at bedside to perform limited ultrasound / BPP.

## 2013-07-08 NOTE — Other (Signed)
Dr. Jimmey RalphParker in room, spec exam done at this time.

## 2013-07-08 NOTE — Other (Signed)
Pt states she has to leave to get home to her five kids and is going to leave against medical advice. AMA form signed. Counseled about s/s of labor and to keep her follow up appt with Dr. Olevia BowensMorelli.

## 2013-07-09 LAB — CULTURE, URINE: Culture: NO GROWTH

## 2013-07-10 NOTE — Patient Instructions (Signed)
Pt advised to f/u with referring physician

## 2013-07-10 NOTE — Progress Notes (Signed)
Pt states left hospital AMA and was unaware of antibiotics and Flagyl-  Instructed pt was called into [facility] pharmacy, veb understanding to pick up

## 2013-07-10 NOTE — Progress Notes (Signed)
Please refer to attached ultrasound report for doctor's evaluation of the clinical information obtained by vital signs, ultrasound, and/or non-stress test along with management recommendation.

## 2013-07-10 NOTE — Progress Notes (Signed)
 MFM Office Visit:  Continue fetal antenatal surveillance protocol with non-stress testing twice weekly and weekly assessment of amniotic fluid volume, biophysical profile, umbilical artery and fetal MCA-PSV doppler waveforms (suboptimal fetal growth with lagging fetal AC, decreased amniotic fluid).     I would advise delivery for fetal intrauterine growth restriction with concurrent condition (oligohydramnios/markedly decreased amniotic fluid volume) from between 34 0/7-37 6/7 weeks' gestation (per ACOG Guidelines Committee Opinion 560 April 2013, unless maternal and/or fetal factors dictate delivery prior to this point), such as abnormalities in umbilical artery doppler waveforms, non-reassuring fetal heart tones, deterioration in biophysical profile testing, subjective decrease in the fetal movements, development of other maternal medical/obstetric complications, etc.  Delivery prior to this should be considered if additional maternal or fetal co-morbidities are identified (i.e. preeclampsia, chronic hypertension, fetal growth restriction, abnormal dopplers, etc.).       Above findings reviewed today with patient. Patient amenable to above plan of care.

## 2013-07-13 NOTE — Progress Notes (Signed)
Dr Katina Dungoost at bedside and pt states she is not comfortable with female doctors. Dr Omer JackKrautkramer to see pt but in a delivery. Pt informed it will be a little while. Pt verbalizes understanding.

## 2013-07-13 NOTE — Other (Signed)
Updated pt on POC that Cervidil will be placed at 0200.  Questions answered and pt verbalizes understanding.

## 2013-07-13 NOTE — Progress Notes (Signed)
Pt presents to L&D ambulatory for a scheduled induction. Pt denies any LOF or bleeding. Pt states she feels a rare contraction. Pt feels baby moving. Pt accompanied by FOB. Pt to 707 to gown and obtain a clean catch urine sample. Dr Omer JackKrautkramer and Dr Katina Dungoost notified of pts arrival.

## 2013-07-13 NOTE — Plan of Care (Signed)
Problem: Pain - Acute:  Goal: Pain level will decrease  Pain level will decrease  Outcome: Ongoing

## 2013-07-13 NOTE — Progress Notes (Signed)
Dr Omer JackKrautkramer updating Dr Olevia BowensMorelli.

## 2013-07-13 NOTE — Plan of Care (Signed)
Problem: Pain - Acute:  Goal: Able to cope with pain  Able to cope with pain  Outcome: Ongoing

## 2013-07-13 NOTE — H&P (Addendum)
OBSTETRICAL HISTORY AND PHYSICAL    Provider:  Dr. Olevia Bowens  Date: 07/13/2013  Time: 8:57 PM    Patient Name: Brittney [name]  Patient DOB: [birth year]  Room/Bed: 0707/0707-01  Admission Date/Time: 07/13/2013  8:29 PM  [id number]      Primary Care Physician: No primary provider on file.          [address] Oswaldo Done     Brittney [name] is a 28 y.o. female [id number]      Chief Complaint:  Presents for induction of labor    HPI: Brittney [name] is a 28 y.o. female who presents for induction of labor for IUGR and oligohydamnios.  She has been followed by MFM.     Pregnant also complicated by circumvallate placenta, history of preterm delivery (17-P allergy), history of preeclampsia and gHTN, smoker.      OB History:   Obstetric History    G7   P6   T2   P4   A0   TAB0   SAB0   E0   M0   L5       # Outcome Date GA Lbr Len/2nd Weight Sex Delivery Anes PTL Lv   7 Current            6 Term 03/22/11 [redacted]w[redacted]d  5 lb 5 oz (2.41 kg) F Vag-Spont  N Y   5 Preterm 03/30/10 [redacted]w[redacted]d  7.9 oz (0.225 kg) M Vag-Spont  Y ND   4 Term 06/22/09 [redacted]w[redacted]d  6 lb 8 oz (2.948 kg) F Vag-Spont   Y   3 Preterm 08/28/07 [redacted]w[redacted]d  4 lb 1 oz (1.843 kg) F Vag-Spont  Y Y   2 Preterm 07/21/06 [redacted]w[redacted]d  6 lb 5 oz (2.863 kg) M Jerral Ralph   1 Preterm 12/02/03 [redacted]w[redacted]d  4 lb 5 oz (1.956 kg) M Vag-Spont   Y          Contractions: No  Rupture Of Membranes: No  Bleeding: No    Estimated Gestational Age:  Patient's last menstrual period was 10/31/2012. normal  Gestational Age: [redacted]w[redacted]d    Estimated Date of Delivery: 08/07/13 by LMP  Pregnancy Risk factors:  Patient Active Problem List   Diagnosis   ??? History of trichomoniasis   ??? Varicosities   ??? Depression   ??? History of gonorrhea   ??? Grand multipara   ??? Fetal demise, less than 22 weeks   ??? Smokes   ??? Subchorionic hematoma in first trimester, antepartum   ??? History of preterm labor, current pregnancy   ??? Pregnancy with other poor obstetric history(V23.49)   ??? Non-compliant pregnant patient   ??? Cervical shortening, antepartum  condition or complication 03/19/2013 2.81 cm    ??? Circumvallate placenta visualized on 03/05/2013   ??? Unspecified antepartum hemorrhage, antepartum   ??? Circumvallate placenta in second trimester   ??? Feeling pelvic pressure in pregnancy, antepartum   ??? Circumvallate placenta   ??? Poor fetal growth, affecting management of mother, antepartum condition or complication   ??? Cervical shortening, antepartum condition or complication   ??? Oligohydramnios, antepartum         Allergies:  Allergies as of 07/13/2013 - Review Complete 07/13/2013   Allergen Reaction Noted   ??? Other Hives 02/03/2013       Medications:  No current facility-administered medications on file prior to encounter.     Current Outpatient Prescriptions on File Prior to Encounter   Medication Sig Dispense Refill   ??? metroNIDAZOLE (  FLAGYL) 500 MG tablet Take 1 tablet by mouth 2 times daily for 7 days.  14 tablet  0   ??? cephALEXin (KEFLEX) 500 MG capsule Take 1 capsule by mouth 4 times daily for 10 days.  40 capsule  0   ??? Diphenhydramine-APAP, sleep, (TYLENOL PM EXTRA STRENGTH PO) Take  by mouth daily as needed.       ??? SEROQUEL XR 50 MG XR tablet     0   ??? famotidine (PEPCID) 20 MG tablet Take 1 tablet by mouth daily.  30 tablet  0   ??? ondansetron (ZOFRAN) 4 MG tablet Take 1 tablet by mouth every 8 hours as needed for Nausea.  30 tablet  1   ??? Prenatal Vitamins (DIS) TABS Take 1 tablet by mouth daily.  30 tablet  12         Antenatal Steroids Given In This Pregnancy:  no     Past Medical History   Diagnosis Date   ??? History of trichomoniasis 03/29/10   ??? 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 Depression      BIPOLAR, SEVERE DEPRESSION ANXIETY   ??? Mild preeclampsia delivered 05,     ELEVATED BP IN EVERY PREGNANCY   ??? Left fetal pyelectasis, antepartum 02/14/2011   ??? Mild preeclampsia delivered    ??? History of gonorrhea 05/30/12     tx'd Rocephin 250 mg IM 05/30/12       No past surgical history on file.    Obstetric History    G7   P6   T2   P4   A0    TAB0   SAB0   E0   M0   L5       # Outcome Date GA Lbr Len/2nd Weight Sex Delivery Anes PTL Lv   7 Current            6 Term 03/22/11 [redacted]w[redacted]d  5 lb 5 oz (2.41 kg) F Vag-Spont  N Y   5 Preterm 03/30/10 [redacted]w[redacted]d  7.9 oz (0.225 kg) M Vag-Spont  Y ND   4 Term 06/22/09 [redacted]w[redacted]d  6 lb 8 oz (2.948 kg) F Vag-Spont   Y   3 Preterm 08/28/07 [redacted]w[redacted]d  4 lb 1 oz (1.843 kg) F Vag-Spont  Y Y   2 Preterm 07/21/06 [redacted]w[redacted]d  6 lb 5 oz (2.863 kg) M Jerral Ralph   1 Preterm 12/02/03 [redacted]w[redacted]d  4 lb 5 oz (1.956 kg) M Vag-Spont   Y          Family History   Problem Relation Age of Onset   ??? Seizures Mother      HEAD TRAMA   ??? Depression Mother    ??? Breast Cancer Neg Hx    ??? Cancer Neg Hx    ??? Colon Cancer Neg Hx    ??? Diabetes Neg Hx    ??? Eclampsia Neg Hx    ??? Hypertension Neg Hx    ??? Ovarian Cancer Neg Hx    ??? Preterm Labor Neg Hx    ??? Spont Abortions Neg Hx    ??? Stroke Neg Hx    ??? Cervical Cancer Neg Hx    ??? Lupus Neg Hx          History     Social History   ??? Marital Status: Single     Spouse Name: N/A     Number of Children: N/A   ???  Years of Education: N/A     Occupational History   ??? Not on file.     Social History Main Topics   ??? Smoking status: Current Every Day Smoker -- 0.50 packs/day for 7 years   ??? Smokeless tobacco: Never Used      Comment: "<1/2pk/day"   ??? Alcohol Use: No   ??? Drug Use: No   ??? Sexual Activity:     Partners: Male     Other Topics Concern   ??? Not on file     Social History Narrative       Review Of Systems:  Patient has a history of depression On seroquel.  Patient has no symptoms of depression  CONSTITUTIONAL:  negative for  fevers, chills, sweats and fatigue  EYES:  negative for  double vision, blind spots and visual disturbance  RESPIRATORY:  negative for  dry cough, cough with sputum, dyspnea and wheezing  CARDIOVASCULAR:  negative for  chest pain, dyspnea, palpitations  GASTROINTESTINAL:  negative for nausea, vomiting, change in bowel habits, diarrhea and abdominal distention  GENITOURINARY:  negative for frequency,  dysuria and nocturia  HEMATOLOGIC/LYMPHATIC:  negative for easy bruising, bleeding and lymphadenopathy  ALLERGIC/IMMUNOLOGIC:  negative for recurrent infections, hay fever and angioedema  ENDOCRINE:  negative for heat intolerance, cold intolerance and weight changes  MUSCULOSKELETAL:  negative for  myalgias, arthralgias and joint swelling  NEUROLOGICAL:  negative for headaches, dizziness, seizures and visual disturbance  BEHAVIOR/PSYCH:  negative for elated mood, agitated, increased agitation and anxiety    Physical Exam:  Filed Vitals:    07/13/13 2048   BP: 131/73   Pulse: 99   Temp: 98.1 ??F (36.7 ??C)   TempSrc: Oral   Resp: 18   Height: 5\' 4"  (1.626 m)   Weight: 136 lb (61.689 kg)       Fetal heart rate:  Baseline Heart Rate 145.  Moderate variability/acels and no decels.     General appearance:  awake, alert, cooperative, no apparent distress, and appears stated age  Neurologic:  Awake, alert, oriented to name, place and time.  Cranial nerves II-XII are grossly intact.  Motor is 5 out of 5 bilaterally.  Cerebellar finger to nose, heel to shin intact.  Sensory is intact.  Babinski down going, Romberg negative, and gait is normal.  Lungs:  No increased work of breathing, good air exchange, clear to auscultation bilaterally, no crackles or wheezing  Heart:  Normal apical impulse, regular rate and rhythm, normal S1 and S2, no S3 or S4, and no murmur noted  Abdomen:  No scars, normal bowel sounds, soft, non-distended, non-tender, no masses palpated, no hepatosplenomegally      Pelvic Exam:  Cervix Check:   DILATION:  1 cm   EFFACEMENT:   25%   STATION:  +1 cm   CONSISTENCY:  medium   POSITION:  posterior    FETAL POSITION: Cephalic.    BISHOPS SCORE:  5    Contraction frequency:  0 minutes        Estimated Fetal Weight (Limited Ultrasound Only):    Position:Cephalic  Placental Location: anterior  Estimated Fetal Weight: 5 Pounds 3 ounces  Fetal Heart Tones:Present      Lab Results:   Blood Type/Rh:    ABO/Rh   Date  Value Range Status   06/19/2013 O POSITIVE   Final     Antibody Screen:    Antibody Screen   Date Value Range Status   06/19/2013 NEGATIVE   Final  Performed at [facility] [address] [place], [place] [address]   [phone]       Hemoglobin:   Hemoglobin   Date Value Range Status   06/19/2013 11.4* 12.0 - 16.0 g/dL Final     Hematocrit:   Hematocrit   Date Value Range Status   06/19/2013 32.8* 36 - 46 % Final     Platelet Count:   Platelet Count   Date Value Range Status   03/30/2010 259  140 - 450 k/uL Final        Platelets   Date Value Range Status   06/19/2013 208  140 - 450 k/uL Final     Rubella:    Rubella Antibody, IGG   Date Value Range Status   06/19/2013 100.1   Final                  REFERENCE RANGE:  <5.0       NON-REACTIVE (non-immune)  5.0 TO 9.9 EQUIVOCAL  >=10.0     REACTIVE     (immune)        NOTE: NEW REFERENCE RANGE  Performed at [facility] [address], [place] [address] [phone]       T. Pallidium, IGG:    No results found for this basename: trepg     Sickle Cell Screen:   No results found for this basename: sickle     Hepatitis B Surface Antigen:   Hepatitis B Surface Ag   Date Value Range Status   06/19/2013 NONREACTIVE  NR Final      Performed at [facility] [address], [place] [address] [phone]     HIV:    HIV 1/2 Antibody   Date Value Range Status   06/19/2013 NONREACTIVE  NR Final                    Interpretation:         The presence of antibody to HIV and its association with the potential   infectivity, transmission or diagnosis of AIDS has not been established.  Furthermore, a 'Non-Reactive' test result does not exclude the possibility of   exposure to or infection with HIV.  If the above test result is 'Reactive', the Laboratory will order the   confirmatory test.  Performed at [facility] [address], [place] [address] [phone]       Gonorrhea:  Negative  Chlamydia:  Negative    1 hour Glucose Tolerance Test:   Glucose   Date  Value Range Status   06/19/2013 75  70 - 99 mg/dL Final      Performed at [facility] [address], [place] [address] [phone]        Glucose tolerance screen 50g   Date Value Range Status   06/19/2013 86  70 - 135 mg/dL Final      Performed at [facility] [address], [place] [address] [phone]       RHIG Screen:   No results found for this basename: rhigs     Group B Strep:    No results found for this basename: gbscx         Assessment:  Brittney [name] is a 28 y.o. female  [phone] At [redacted]w[redacted]d    OB History    Gravida Para Term Preterm AB TAB SAB Ectopic Multiple Living    7 6 2 4  0 0 0 0 0 5  INDUCTION            Past Medical History   Diagnosis Date   ??? History of trichomoniasis 03/29/10   ??? 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 Depression      BIPOLAR, SEVERE DEPRESSION ANXIETY   ??? Mild preeclampsia delivered 05,     ELEVATED BP IN EVERY PREGNANCY   ??? Left fetal pyelectasis, antepartum 02/14/2011   ??? Mild preeclampsia delivered    ??? History of gonorrhea 05/30/12     tx'd Rocephin 250 mg IM 05/30/12         Patient Active Problem List    Diagnosis Date Noted   ??? Cervical shortening, antepartum condition or complication 03/19/2013 2.81 cm  03/05/2013     Priority: High     Referral for 17 P pt declined per MFM  CL every 1-2 weeks until 32 weeks  Follow up in 2 weeks 03/19/2013 per MFM     ??? Circumvallate placenta visualized on 03/05/2013 03/05/2013     Priority: High     Growth scans every 3-4 weeks starting at 24 weeks  32 week antenatal testing     ??? Non-compliant pregnant patient 02/03/2013     Priority: High   ??? History of preterm labor, current pregnancy 01/23/2013     Priority: High     01/23/2013 CL every 1-2 weeks between -32 weeks  17 P to start 16-36 weeks  RTO in 5 weeks per MFM     ??? Grand multipara 01/06/2013     Priority: High   ??? Fetal demise, less than 22 weeks 01/06/2013     Priority: High   ??? Smokes 01/06/2013     Priority: High     Discussed smoking  cessation     ??? History of gonorrhea      Priority: Medium   ??? History of trichomoniasis      Priority: Medium   ??? Varicosities      Priority: Low     LEGS, WORSE WHEN PREGNANT     ??? Depression      Priority: Low     BIPOLAR, SEVERE DEPRESSION ANXIETY, Ordered to go to counseling but refuses admission, reports ? suicidal  Attempts in pass, refuses medication.     ??? Cervical shortening, antepartum condition or complication 07/10/2013   ??? Oligohydramnios, antepartum 07/10/2013   ??? Circumvallate placenta 07/03/2013   ??? Poor fetal growth, affecting management of mother, antepartum condition or complication 07/03/2013   ??? Feeling pelvic pressure in pregnancy, antepartum 04/07/2013   ??? Circumvallate placenta in second trimester 03/19/2013   ??? Unspecified antepartum hemorrhage, antepartum 03/05/2013   ??? Subchorionic hematoma in first trimester, antepartum 01/23/2013   ??? Pregnancy with other poor obstetric history(V23.49) 01/23/2013       Plan:  Antenatal Steroids Given this admission : No      Induction of labor secondary to IUGR and low amniotic fluid for gestational age.   Cervidil @ 0200  GBS swab  Pen G for GBS prophylaxis      Risks, benefits, alternatives and possible complications have been discussed in detail with the patient. Admission, and post admission procedures and expectations were discussed in detail. All questions were answered.    The literature regarding a questionable link to pitocin augmentation and induction of labor, the assistance of labor contractions and the initiation of contractions to help delivery, have been reviewed with the patient regarding the increased potential of  having a newborn with Attention Deficit Hyperactivity Disorder and or Autism. These two disorders and the ramifications of their impact on a child and the family caring for that child has been reviewed with the patient in detail. She was given the risks, benefits and alternatives of the use of this medication. She has agreed to  its use in the delivery of her unborn child if needed at the time of delivery, Yes.    Orders Placed This Encounter   Procedures   ??? CBC auto differential     Standing Status: Standing      Number of Occurrences: 1      Standing Expiration Date:    ??? T. pallidum Ab (Order in [place] only instead of RPR)     Standing Status: Standing      Number of Occurrences: 1      Standing Expiration Date:    ??? UA W/REFLEX CULTURE     Standing Status: Standing      Number of Occurrences: 1      Standing Expiration Date:    ??? DIET GENERAL;     Standing Status: Standing      Number of Occurrences: 1      Standing Expiration Date:    ??? Verify informed consent     Standing Status: Standing      Number of Occurrences: 1      Standing Expiration Date:    ??? Verify history and physical completed     Standing Status: Standing      Number of Occurrences: 1      Standing Expiration Date:    ??? Vital signs per unit routine     Standing Status: Standing      Number of Occurrences: 1      Standing Expiration Date:    ??? Monitor fetal heart rate (external)     Standing Status: Standing      Number of Occurrences: 1      Standing Expiration Date:    ??? External uterine contraction monitoring     Standing Status: Standing      Number of Occurrences: 1      Standing Expiration Date:    ??? Notify physician for     Notify physician for pulse less than 50 or greater than 120, respiratory rate less than 12 or greater than 25, temperature greater than 101.3 F (38.5 C), urine output less than 6230ml/hr, SBP less than 90 or greater than 160, DBP greater than 100, abnormal bleeding, or if persistent late decels.     Standing Status: Standing      Number of Occurrences: 1      Standing Expiration Date:    ??? I/O per unit routine     Every shift if on IV [employer].     Standing Status: Standing      Number of Occurrences: 1      Standing Expiration Date:    ??? Maintain IV access     Standing Status: Standing      Number of Occurrences: 1      Standing  Expiration Date:    ??? Position change     PRN Intra-uterine resuscitation for hypertonus, hyperstimulation, or non-reassuring fetal status.     Standing Status: Standing      Number of Occurrences: 1      Standing Expiration Date:    ??? Discontinue Pitocin     PRN Intra-uterine resuscitation for hypertonus, hyperstimulation, or non-reassuring fetal status.  Standing Status: Standing      Number of Occurrences: 1      Standing Expiration Date:    ??? Increase IV fluids/IV bolus     PRN Intra-uterine resuscitation for hypertonus, hyperstimulation, or non-reassuring fetal status.       Standing Status: Standing      Number of Occurrences: 1      Standing Expiration Date:    ??? Continue oxytocin (PITOCIN) fluids     After delivery of placenta, run oxytocin (PITOCIN) 30 units in lactated ringers IV infusion wide open until bleeding controlled, then continue at 10 units/hr.       Standing Status: Standing      Number of Occurrences: 1      Standing Expiration Date:    ??? Up as tolerated     Standing Status: Standing      Number of Occurrences: 99999      Standing Expiration Date:    ??? Full Code     Standing Status: Standing      Number of Occurrences: 1      Standing Expiration Date:    ??? TYPE AND SCREEN     Specimen is valid for 3 days - nurse to verify valid specimen     Standing Status: Standing      Number of Occurrences: 1      Standing Expiration Date:    ??? Patient status Admit as Inpatient     Standing Status: Standing      Number of Occurrences: 1      Standing Expiration Date:      Order Specific Question:  Patient status     Answer:  Admit as Inpatient     Order Specific Question:  REQUIRED: Diagnosis     Answer:  IUGR (intrauterine growth restriction) [[id number]]     Order Specific Question:  Estimated Length of Stay     Answer:  Estimated stay of more than 2 midnights     Order Specific Question:  Future Attending Provider     Answer:  Para Skeans [[id number]]       Attending's Name: Dr.  Olevia Bowens        Osteopathic Musculoskeletal Exam of the Hospitalized Patient  Roost, Jonny Ruiz, DO     Reason for No Exam (if applicable): Pt refusal.  Structural complaints            Electronically signed by Carmin Escudilla Bonita, DO on 07/13/2013 at 8:57 PM   Mack Hook, DO

## 2013-07-13 NOTE — Discharge Summary (Signed)
Obstetric Discharge Summary    Patient Name: Brittney [name]  Patient DOB: 02/11/1986  Primary Care Physician: No primary provider on file.      Principal Diagnosis  28 yo G7P2405 @ 36 3/7  Low AFI for gestational age  IUGR    Other Diagnosis:   INDUCTION  Patient Active Problem List   Diagnosis   ??? History of trichomoniasis   ??? Varicosities   ??? Depression   ??? History of gonorrhea   ??? Grand multipara   ??? Fetal demise, less than 22 weeks   ??? Smokes   ??? Subchorionic hematoma in first trimester, antepartum   ??? History of preterm labor, current pregnancy   ??? Pregnancy with other poor obstetric history(V23.49)   ??? Non-compliant pregnant patient   ??? Cervical shortening, antepartum condition or complication 03/19/2013 2.81 cm    ??? Circumvallate placenta visualized on 03/05/2013   ??? Unspecified antepartum hemorrhage, antepartum   ??? Circumvallate placenta in second trimester   ??? Feeling pelvic pressure in pregnancy, antepartum   ??? Circumvallate placenta   ??? Poor fetal growth, affecting management of mother, antepartum condition or complication   ??? Cervical shortening, antepartum condition or complication   ??? Oligohydramnios (6.35)   ??? IUGR (11%)   ??? PLTCS 07/15/13 F Apg 8,9 Wt. 5#7       Infection: Yes, BV   Hospital Acquired: No    Surgical Operations & Procedures  Analgesia: Epidural: Yes; Spinal:  No  Delivery Type:  C-section with labor  Lacerations: n/a    Consultations: Anesthesia, NICU    Pertinent Findings & Procedures:   Brittney [name] is a 28 y.o. female at 3812w3d presents for MFM recommended IOL secondary to IUGR and low AFI for gestational age.  She received Pen G for GBS prophylaxis. Cervidil, Pitocin and Nubain for pain.  She had AROM with clear fluid and IUPC placed. She received epidural.  Pitocin turned off after recurrent fetal decelerations.  She had no cervical change and continued to have decelerations without cervical change.  Decision was made for cesarean section    She delivered by C-section with  labor a viable    Information for the patient's newborn:  Rollen SoxHayman, Babygirl [[id number]][[id number]]   female  Birth Weight: 5 lb 7 oz (2.466 kg)     with Apgars 8 at 1 minute, 9 at 5 minutes.  Postpartum course normal.      Course of patient: Normal    Discharge to: Home    Readmission planned: No    Recommendations on Discharge  Meds:      Medication List      START taking these medications         docusate 100 MG Caps   Commonly known as:  COLACE, DULCOLAX   Take 100 mg by mouth 2 times daily as needed for Constipation.       ferrous sulfate 325 (65 FE) MG tablet   Take 1 tablet by mouth daily (with breakfast).       ibuprofen 600 MG tablet   Commonly known as:  ADVIL;MOTRIN   Take 1 tablet by mouth every 6 hours as needed.       oxyCODONE-acetaminophen 5-325 MG per tablet   Commonly known as:  PERCOCET   Take 1 tablet by mouth every 6 hours as needed for Pain for up to 7 days.         CHANGE how you take these medications  metroNIDAZOLE 500 MG tablet   Commonly known as:  FLAGYL   Take 1 tablet by mouth every 12 hours for 4 days.   What changed:  when to take this         CONTINUE taking these medications         famotidine 20 MG tablet   Commonly known as:  PEPCID   Take 1 tablet by mouth daily.       ondansetron 4 MG tablet   Commonly known as:  ZOFRAN   Take 1 tablet by mouth every 8 hours as needed for Nausea.       Prenatal Vitamins (DIS) Tabs   Take 1 tablet by mouth daily.       SEROQUEL XR 50 MG XR tablet   Generic drug:  QUEtiapine         STOP taking these medications         cephALEXin 500 MG capsule   Commonly known as:  KEFLEX       TYLENOL PM EXTRA STRENGTH PO         Where to Get Your Medications    These are the prescriptions that you need to pick up.        You may get the following medications from any pharmacy   -  docusate 100 MG Caps   -  ferrous sulfate 325 (65 FE) MG tablet   -  ibuprofen 600 MG tablet   -  metroNIDAZOLE 500 MG tablet   -  oxyCODONE-acetaminophen 5-325 MG per tablet                       Activity: pelvic rest x 6 weeks, no driving on narcotics, no lifting greater than 15 lbs  Diet: regular diet  Follow up: 2 weeks, H&H in 2 weeks    Condition on discharge: good and stable     Discharge Date: 07/18/13    Chipper Koudelka RAE Chisom Muntean D.O., DO      Comments:  Home care, Follow-up care and birth control were reviewed.  Signs and symptoms of mastitis and Post Partum Depression were reviewed.  The patient is to notify her physician if any of these occur. The patient was counseled on secondary smoke risks and the increased risk of sudden infant death syndrome and respiratory problems to her baby with exposure. She was counseled on various alternate recommendations to decrease the exposure to secondary smoke to her children.

## 2013-07-14 ENCOUNTER — Inpatient Hospital Stay
Admit: 2013-07-14 | Disposition: A | Discharge status: Home / Self Care | Attending: Obstetrics & Gynecology | Admitting: Obstetrics & Gynecology

## 2013-07-14 LAB — MICROSCOPIC URINALYSIS
Epithelial Cells UA: 10 /HPF (ref 0–5)
RBC, UA: 0 /HPF (ref 0–2)
WBC, UA: 10 /HPF (ref 0–5)

## 2013-07-14 LAB — CBC WITH AUTO DIFFERENTIAL
Absolute Eos #: 0.1 10*3/uL (ref 0.0–0.4)
Absolute Lymph #: 3 10*3/uL (ref 1.0–4.8)
Absolute Mono #: 0.5 10*3/uL (ref 0.1–1.2)
Basophils Absolute: 0 10*3/uL (ref 0.0–0.2)
Basophils: 0 % (ref 0–2)
Eosinophils %: 1 % (ref 1–4)
Hematocrit: 32 % — ABNORMAL LOW (ref 36–46)
Hemoglobin: 11.1 g/dL — ABNORMAL LOW (ref 12.0–16.0)
Lymphocytes: 33 % (ref 24–44)
MCH: 30.8 pg (ref 26–34)
MCHC: 34.7 g/dL (ref 31–37)
MCV: 88.8 fL (ref 80–100)
MPV: 9.2 fL (ref 6.0–12.0)
Monocytes: 5 % (ref 2–11)
Platelets: 238 10*3/uL (ref 140–450)
RBC: 3.61 m/uL — ABNORMAL LOW (ref 4.0–5.2)
RDW: 13.4 % (ref 12.5–15.4)
Seg Neutrophils: 61 % (ref 36–66)
Segs Absolute: 5.3 10*3/uL (ref 1.8–7.7)
WBC: 8.9 10*3/uL (ref 3.5–11.0)

## 2013-07-14 LAB — UA W/REFLEX CULTURE
Bilirubin Urine: NEGATIVE
Nitrite, Urine: NEGATIVE
Specific Gravity, UA: 1.017 (ref 1.005–1.030)
Urine Hgb: NEGATIVE
Urobilinogen, Urine: NORMAL
pH, UA: 6.5 (ref 5.0–8.0)

## 2013-07-14 LAB — URINE CULTURE CLEAN CATCH: Culture: NO GROWTH

## 2013-07-14 LAB — T. PALLIDUM AB: T. Pallidum Ab: NONREACTIVE

## 2013-07-14 LAB — TYPE AND SCREEN
ABO/Rh: O POS
Antibody Screen: NEGATIVE

## 2013-07-14 MED ORDER — ACETAMINOPHEN 325 MG PO TABS
325 | ORAL | Status: DC | PRN
Start: 2013-07-14 — End: 2013-07-15

## 2013-07-14 MED ADMIN — PRENATAL VITAMINS PLUS 27-1 MG tablet 1 tablet: 1 | ORAL | @ 13:00:00 | NDC 60258018301

## 2013-07-14 MED ADMIN — penicillin G potassium 2.5 Million Units in dextrose 5 % 100 mL IVPB: 2.5 10*6.[IU] | INTRAVENOUS | @ 10:00:00 | NDC 00264151032

## 2013-07-14 MED ADMIN — penicillin G potassium 2.5 Million Units in dextrose 5 % 100 mL IVPB: 2.5 10*6.[IU] | INTRAVENOUS | @ 07:00:00 | NDC 00264151032

## 2013-07-14 MED ADMIN — penicillin G potassium 2.5 Million Units in dextrose 5 % 100 mL IVPB: 2.5 10*6.[IU] | INTRAVENOUS | @ 19:00:00 | NDC 00264151032

## 2013-07-14 MED ADMIN — penicillin G potassium 2.5 Million Units in dextrose 5 % 100 mL IVPB: 2.5 10*6.[IU] | INTRAVENOUS | @ 22:00:00 | NDC 00264151032

## 2013-07-14 MED ADMIN — dinoprostone (CERVIDIL) vaginal insert 10 mg: 10 mg | VAGINAL | @ 06:00:00 | NDC 00456412363

## 2013-07-14 MED ADMIN — ondansetron (ZOFRAN) injection 4 mg: 4 mg | INTRAVENOUS | @ 13:00:00 | NDC 00409475503

## 2013-07-14 MED ADMIN — lactated ringers infusion: INTRAVENOUS | @ 20:00:00 | NDC 00338011704

## 2013-07-14 MED ADMIN — oxytocin (PITOCIN) 30 units in 500 mL infusion: 1 m[IU]/min | INTRAVENOUS | @ 19:00:00 | NDC 09999990087

## 2013-07-14 MED ADMIN — lactated ringers infusion: INTRAVENOUS | @ 03:00:00 | NDC 00338011704

## 2013-07-14 MED ADMIN — penicillin G potassium 5 Million Units in dextrose 5 % 100 mL IVPB (mini-bag): 5 10*6.[IU] | INTRAVENOUS | @ 03:00:00 | NDC 00338055118

## 2013-07-14 MED ADMIN — famotidine (PEPCID) tablet 20 mg: 20 mg | ORAL | @ 13:00:00 | NDC 00172572880

## 2013-07-14 MED ADMIN — lactated ringers infusion: INTRAVENOUS | @ 10:00:00 | NDC 00338011704

## 2013-07-14 MED ADMIN — nalbuphine (NUBAIN) injection 10 mg: 10 mg | INTRAVENOUS | @ 23:00:00 | NDC 00409146301

## 2013-07-14 MED ADMIN — penicillin G potassium 2.5 Million Units in dextrose 5 % 100 mL IVPB: 2.5 10*6.[IU] | INTRAVENOUS | @ 14:00:00 | NDC 00264151032

## 2013-07-14 MED FILL — SEROQUEL XR 50 MG PO TB24: 50 MG | ORAL | Qty: 1

## 2013-07-14 MED FILL — PFIZERPEN-G 5000000 UNITS IJ SOLR: 5000000 units | INTRAMUSCULAR | Qty: 5

## 2013-07-14 MED FILL — ONDANSETRON HCL 4 MG/2ML IJ SOLN: 4 MG/2ML | INTRAMUSCULAR | Qty: 2

## 2013-07-14 MED FILL — CERVIDIL 10 MG VA INST: 10 MG | VAGINAL | Qty: 1

## 2013-07-14 MED FILL — DEXTROSE 5 % IV SOLN: 5 % | INTRAVENOUS | Qty: 100

## 2013-07-14 MED FILL — NALBUPHINE HCL 10 MG/ML IJ SOLN: 10 MG/ML | INTRAMUSCULAR | Qty: 1

## 2013-07-14 MED FILL — DOK 100 MG PO CAPS: 100 MG | ORAL | Qty: 1

## 2013-07-14 MED FILL — FAMOTIDINE 20 MG PO TABS: 20 MG | ORAL | Qty: 1

## 2013-07-14 MED FILL — PRENAPLUS 27-1 MG PO TABS: 27-1 MG | ORAL | Qty: 1

## 2013-07-14 MED FILL — OXYTOCIN 30 UNITS IN 500 ML INFUSION: 30 UNIT/500ML | INTRAVENOUS | Qty: 500

## 2013-07-14 NOTE — Other (Signed)
Initial assessment with patient, c/o 10-10 pain with ctx. Patient is refusing epidural at this time. Nubain ordered as needed. Dr Omer JackKrautkramer in room to evaluate and SVE done with no change from previous check, to update Dr Olevia BowensMorelli on patient status.  Fleet ContrasB. Jenae Tomasello, RN

## 2013-07-14 NOTE — Other (Signed)
Patient sleeping after Nubain given;appears comfortable. Will continue to monitor.  Fleet ContrasB. Niko Penson, RN

## 2013-07-14 NOTE — Other (Signed)
GBS swab obtained and sent to lab.

## 2013-07-14 NOTE — Other (Signed)
Patient breathing through contractions well.  Patient states pain with contractions is tolerable at this time.  Offered patient positioning aids (birthing ball, rocking chair), refused at this time.

## 2013-07-14 NOTE — Progress Notes (Signed)
Patient Name: Brittney [name]  Patient DOB: Oct 23, 1985  Room/Bed: 0707/0707-01  Admission Date/Time: 07/13/2013  8:29 PM    Date: 07/14/2013  Time: 8:34 AM        Brittney [name] is a 28 y.o. female  Obstetric History    G7   P6   T2   P4   A0   TAB0   SAB0   E0   M0   L5       # Outcome Date GA Lbr Len/2nd Weight Sex Delivery Anes PTL Lv   7 Current            6 Term 03/22/11 [redacted]w[redacted]d  5 lb 5 oz (2.41 kg) F Vag-Spont  N Y   5 Preterm 03/30/10 [redacted]w[redacted]d  7.9 oz (0.225 kg) M Vag-Spont  Y ND   4 Term 06/22/09 [redacted]w[redacted]d  6 lb 8 oz (2.948 kg) F Vag-Spont   Y   3 Preterm 08/28/07 [redacted]w[redacted]d  4 lb 1 oz (1.843 kg) F Vag-Spont  Y Y   2 Preterm 07/21/06 [redacted]w[redacted]d  6 lb 5 oz (2.863 kg) M Jerral Ralph   1 Preterm 12/02/03 [redacted]w[redacted]d  4 lb 5 oz (1.956 kg) M Vag-Spont   Y          Gestational Age:  [redacted]w[redacted]d      The patient was seen and examined. The details of her pelvic exam can be found in the EPIC flow section of her EMR. Her pain  is well controlled. Pt states feeling irregular cramping.  The baby is moving well.    Tracing Review (Date of Tracing):  There Is moderate Variability  Filed Vitals:    07/14/13 0400 07/14/13 0500 07/14/13 0700 07/14/13 0821   BP: 117/78 119/60 135/86    [name]: 96 74 101 94   Temp:    98.3 ??F (36.8 ??C)   TempSrc:    Oral   Resp: 16 16 16 16    Height:       Weight:         FHT's are 130  The tracing is Category 1 .    Intervention:  None      Membranes Are: Intact  Scalp Electrode in place: No  Intrauterine Pressure Catheter in Place: No        4 Week Lab Review:  Admission on 07/13/2013   Component Date Value Range Status   ??? WBC 07/13/2013 8.9  3.5 - 11.0 k/uL Final   ??? RBC 07/13/2013 3.61* 4.0 - 5.2 m/uL Final   ??? Hemoglobin 07/13/2013 11.1* 12.0 - 16.0 g/dL Final   ??? Hematocrit 07/13/2013 32.0* 36 - 46 % Final   ??? MCV 07/13/2013 88.8  80 - 100 fL Final   ??? MCH 07/13/2013 30.8  26 - 34 pg Final   ??? MCHC 07/13/2013 34.7  31 - 37 g/dL Final   ??? RDW 16/01/9603 13.4  12.5 - 15.4 % Final   ??? Platelets 07/13/2013 238  140 -  450 k/uL Final   ??? MPV 07/13/2013 9.2  6.0 - 12.0 fL Final   ??? Differential Type 07/13/2013 NOT REPORTED   Final   ??? Seg Neutrophils 07/13/2013 61  36 - 66 % Final   ??? Lymphocytes 07/13/2013 33  24 - 44 % Final   ??? Monocytes 07/13/2013 5  2 - 11 % Final   ??? Eosinophils Relative Percent 07/13/2013 1  1 - 4 % Final   ??? Basophils 07/13/2013 0  0 - 2 % Final   ??? Segs Absolute 07/13/2013 5.30  1.8 - 7.7 k/uL Final   ??? Absolute Lymph # 07/13/2013 3.00  1.0 - 4.8 k/uL Final   ??? Absolute Mono # 07/13/2013 0.50  0.1 - 1.2 k/uL Final   ??? Absolute Eos # 07/13/2013 0.10  0.0 - 0.4 k/uL Final   ??? Basophils Absolute 07/13/2013 0.00  0.0 - 0.2 k/uL Final    [facility] [address], [place] [address] [phone]   ??? WBC Morphology 07/13/2013 NOT REPORTED   Final   ??? RBC Morphology 07/13/2013 NOT REPORTED   Final   ??? Platelet Estimate 07/13/2013 NOT REPORTED   Final   ??? Expiration Date 07/13/2013 07/16/2013   Final   ??? Arm Band Number 07/13/2013 [id number]   Final   ??? ABO/Rh 07/13/2013 O POSITIVE   Final   ??? Antibody Screen 07/13/2013 NEGATIVE   Final    Comment: Performed at [facility] [address],   [place] [address] [phone]     ??? Color, UA 07/13/2013 YELLOW  YEL Final   ??? Turbidity UA 07/13/2013 CLOUDY* CLEAR Final   ??? Glucose, Ur 07/13/2013 1+* NEG Final   ??? Bilirubin Urine 07/13/2013 NEGATIVE  NEG Final   ??? Ketones, Urine 07/13/2013 SMALL* NEG Final   ??? Specific Gravity, UA 07/13/2013 1.017  1.005 - 1.030 Final   ??? Urine Hgb 07/13/2013 NEGATIVE  NEG Final   ??? pH, UA 07/13/2013 6.5  5.0 - 8.0 Final   ??? Protein, UA 07/13/2013 TRACE* NEG Final   ??? Urobilinogen, Urine 07/13/2013 Normal  NORM Final   ??? Nitrite, Urine 07/13/2013 NEGATIVE  NEG Final   ??? Leukocyte Esterase, Urine 07/13/2013 SMALL* NEG Final   ??? Urinalysis Comments 07/13/2013 Culture ordered based on defined criteria.   Corrected    [facility] [address] [place] [address] [phone]   ??? - 07/13/2013         Final   ??? WBC, UA 07/13/2013 10 TO 20  0 - 5 /HPF Final   ??? RBC, UA 07/13/2013 0 TO 2  0 - 2 /HPF Final   ??? Casts UA 07/13/2013 NOT REPORTED  0 - 2 /LPF Final   ??? Crystals UA 07/13/2013 NOT REPORTED  NONE /HPF Final   ??? Epithelial Cells UA 07/13/2013 10 TO 20  0 - 5 /HPF Final   ??? Renal Epithelial, Urine 07/13/2013 NOT REPORTED  0 /HPF Final   ??? Bacteria, UA 07/13/2013 FEW* NONE Final   ??? Mucus, UA 07/13/2013 1+* NONE Final    [facility] [address] [place], [place] [address] [phone]   ??? Trichomonas, UA 07/13/2013 NOT REPORTED  NONE Final   ??? Amorphous, UA 07/13/2013 NOT REPORTED  NONE Final   ??? Other Observations UA 07/13/2013 NOT REPORTED  NREQ Final   ??? Yeast, UA 07/13/2013 NOT REPORTED  NONE Final   ??? Specimen 07/13/2013 CLEAN CATCH URINE   Final   ??? Special Requests 07/13/2013 NOT REPORTED   Final   ??? Culture 07/13/2013 CULTURE IN PROGRESS   Final   ??? Culture 07/13/2013 [facility] [address], [place] [address] [phone]   Final   ??? Status 07/13/2013 Pending   Incomplete   Admission on 07/08/2013, Discharged on 07/09/2013   Component Date Value Range Status   ??? Specimen 07/08/2013 CLEAN CATCH URINE   Final   ??? Special Requests 07/08/2013 NOT REPORTED   Final   ???  Culture 07/08/2013 NO SIGNIFICANT GROWTH   Final   ??? Culture 07/08/2013 [facility] [address] [place], [place] [id number] [phone]   Final   ??? Status 07/08/2013 FINAL 07/09/2013   Final   ??? Color, UA 07/08/2013 YELLOW  YEL Final   ??? Turbidity UA 07/08/2013 CLOUDY* CLEAR Final   ??? Glucose, Ur 07/08/2013 1+* NEG Final   ??? Bilirubin Urine 07/08/2013 NEGATIVE  NEG Final   ??? Ketones, Urine 07/08/2013 NEGATIVE  NEG Final   ??? Specific Gravity, UA 07/08/2013 1.013  1.005 - 1.030 Final   ??? Urine Hgb 07/08/2013 NEGATIVE  NEG Final   ??? pH, UA 07/08/2013 7.0  5.0 - 8.0 Final   ??? Protein, UA 07/08/2013 NEGATIVE  NEG Final   ??? Urobilinogen, Urine 07/08/2013 Normal  NORM Final   ??? Nitrite, Urine 07/08/2013 NEGATIVE  NEG Final   ??? Leukocyte  Esterase, Urine 07/08/2013 TRACE* NEG Final    [facility] [address] [place], [place] [address] [phone]   ??? Urinalysis Comments 07/08/2013 NOT REPORTED   Final   ??? - 07/08/2013        Final   ??? WBC, UA 07/08/2013 2 TO 5  0 - 5 /HPF Final   ??? RBC, UA 07/08/2013 0 TO 2  0 - 2 /HPF Final   ??? Casts UA 07/08/2013 NOT REPORTED  0 - 2 /LPF Final   ??? Crystals UA 07/08/2013 NOT REPORTED  NONE /HPF Final   ??? Epithelial Cells UA 07/08/2013 5 TO 10  0 - 5 /HPF Final   ??? Renal Epithelial, Urine 07/08/2013 NOT REPORTED  0 /HPF Final   ??? Bacteria, UA 07/08/2013 FEW* NONE Final    [facility] [address] [place], [place] [id number] [phone]   ??? Mucus, UA 07/08/2013 NOT REPORTED  NONE Final   ??? Trichomonas, UA 07/08/2013 NOT REPORTED  NONE Final   ??? Amorphous, UA 07/08/2013 NOT REPORTED  NONE Final   ??? Other Observations UA 07/08/2013 NOT REPORTED  NREQ Final   ??? Yeast, UA 07/08/2013 NOT REPORTED  NONE Final   Hospital Outpatient Visit on 06/19/2013   Component Date Value Range Status   ??? Hemoglobin A1C 06/19/2013 4.7  4.0 - 6.0 % Final   ??? Estimated Avg Glucose 06/19/2013 88   Final    Comment: The ADA and AACC recommend providing the estimated average glucose result to   permit better patient understanding of their HBA1c result.  Performed at [facility] [address] [place], [place] [id number] [phone]     ??? Hepatitis B Surface Ag 06/19/2013 NONREACTIVE  NR Final    Performed at [facility] [address] [place], [place] [address] [phone]   ??? Color, UA 06/19/2013 YELLOW  YEL Final   ??? Turbidity UA 06/19/2013 CLEAR  CLEAR Final   ??? Glucose, Ur 06/19/2013 3+* NEG Final   ??? Bilirubin Urine 06/19/2013 NEGATIVE  NEG Final   ??? Ketones, Urine 06/19/2013 NEGATIVE  NEG Final   ??? Specific Gravity, UA 06/19/2013 1.020  1.000 - 1.030 Final   ??? Urine Hgb 06/19/2013 NEGATIVE  NEG Final   ??? pH, UA 06/19/2013 7.5  5.0 - 8.0 Final   ??? Protein, UA 06/19/2013 NEGATIVE  NEG Final   ??? Urobilinogen, Urine 06/19/2013 Normal  NORM  Final   ??? Nitrite, Urine 06/19/2013 NEGATIVE  NEG Final   ??? Leukocyte Esterase, Urine 06/19/2013 NEGATIVE  NEG Final   ??? Urinalysis Comments 06/19/2013 Microscopic exam not performed based on chemical  results unless requested in   Final    Comment:  original order.  Performed at [facility] [address] [place], [place] [address]   [phone]     ??? TSH 06/19/2013 0.99  0.30 - 5.00 mIU/L Final    Performed at [facility] [address], [place] [address] [phone]   ??? WBC 06/19/2013 6.9  3.5 - 11.0 k/uL Final   ??? RBC 06/19/2013 3.62* 4.0 - 5.2 m/uL Final   ??? Hemoglobin 06/19/2013 11.4* 12.0 - 16.0 g/dL Final   ??? Hematocrit 06/19/2013 32.8* 36 - 46 % Final   ??? MCV 06/19/2013 90.7  80 - 100 fL Final   ??? MCH 06/19/2013 31.5  26 - 34 pg Final   ??? MCHC 06/19/2013 34.7  31 - 37 g/dL Final   ??? RDW 52/84/1324 13.5  12.5 - 15.4 % Final   ??? Platelets 06/19/2013 208  140 - 450 k/uL Final   ??? MPV 06/19/2013 10.3  6.0 - 12.0 fL Final   ??? Differential Type 06/19/2013 NOT REPORTED   Final   ??? Seg Neutrophils 06/19/2013 64  36 - 66 % Final   ??? Lymphocytes 06/19/2013 28  24 - 44 % Final   ??? Monocytes 06/19/2013 6  2 - 11 % Final   ??? Eosinophils Relative Percent 06/19/2013 1  1 - 4 % Final   ??? Basophils 06/19/2013 1  0 - 2 % Final   ??? Segs Absolute 06/19/2013 4.40  1.8 - 7.7 k/uL Final   ??? Absolute Lymph # 06/19/2013 1.90  1.0 - 4.8 k/uL Final   ??? Absolute Mono # 06/19/2013 0.40  0.1 - 1.2 k/uL Final   ??? Absolute Eos # 06/19/2013 0.10  0.0 - 0.4 k/uL Final   ??? Basophils Absolute 06/19/2013 0.10  0.0 - 0.2 k/uL Final    Performed at [facility] [address], [place] [address] [phone]   ??? WBC Morphology 06/19/2013 NOT REPORTED   Final   ??? RBC Morphology 06/19/2013 NOT REPORTED   Final   ??? Platelet Estimate 06/19/2013 NOT REPORTED   Final   ??? Glucose 06/19/2013 75  70 - 99 mg/dL Final    Performed at [facility] [address], [place] [address] [phone]   ??? Rubella Antibody, IGG 06/19/2013  100.1   Final    Comment:             REFERENCE RANGE:  <5.0       NON-REACTIVE (non-immune)  5.0 TO 9.9 EQUIVOCAL  >=10.0     REACTIVE     (immune)        NOTE: NEW REFERENCE RANGE  Performed at [facility] [address], [place] [address] [phone]     ??? ABO/Rh 06/19/2013 O POSITIVE   Final   ??? Antibody Screen 06/19/2013 NEGATIVE   Final    Comment: Performed at [facility] [address] [place], [place] [address]   [phone]     ??? HIV 1/2 Antibody 06/19/2013 NONREACTIVE  NR Final    Comment:               Interpretation:         The presence of antibody to HIV and its association with the potential   infectivity, transmission or diagnosis of AIDS has not been established.  Furthermore, a 'Non-Reactive' test result does not exclude the possibility of   exposure to or infection with HIV.  If the above test  result is 'Reactive', the Laboratory will order the   confirmatory test.  Performed at [facility] [address] [place], [place] [address] [phone]     ??? T. Pallidum Ab 06/19/2013 NONREACTIVE  NR Final    Comment:       T. pallidum antibodies are not detected.  There is no serological evidence of infection with T. pallidum (early primary   syphilis cannot be excluded).  Retest in 2-4 weeks if syphilis is clinically   suspect.        Performed at [facility] [address] [place] [address] [phone]     ??? GLU ADMN 06/19/2013 Glucola   Final    Comment: Performed at [facility] [address] [place], [place] [address]   [phone]     ??? Glucose tolerance screen 50g 06/19/2013 86  70 - 135 mg/dL Final    Performed at [facility] [address], [place] [address] [phone]   ]    BP 135/86    [name] 94    Temp(Src) 98.3 ??F (36.8 ??C) (Oral)    Resp 16    Ht 5\' 4"  (1.626 m)    Wt 136 lb (61.689 kg)    BMI 23.33 kg/m2      LMP 10/31/2012         Pelvic Exam:  Cervix Check: deferred until cervidil removed        Assessment:  1. Brittney [name] is a 28 y.o.  female [phone] [redacted]w[redacted]d     2.   OB History   Gravida Para Term Preterm AB SAB TAB Ectopic Multiple Living   7 6 2 4  0 0 0 0 0 5      # Outcome Date GA Lbr Len/2nd Weight Sex Delivery Anes PTL Lv   7 Current            6 Term 03/22/11 [redacted]w[redacted]d  5 lb 5 oz (2.41 kg) F Vag-Spont  N Y   5 Preterm 03/30/10 [redacted]w[redacted]d  7.9 oz (0.225 kg) M Vag-Spont  Y ND      Comments: lived for 1.5 hrs   4 Term 06/22/09 [redacted]w[redacted]d  6 lb 8 oz (2.948 kg) F Vag-Spont   Y   3 Preterm 08/28/07 [redacted]w[redacted]d  4 lb 1 oz (1.843 kg) F Vag-Spont  Y Y   2 Preterm 07/21/06 [redacted]w[redacted]d  6 lb 5 oz (2.863 kg) M Jerral Ralph   1 Preterm 12/02/03 [redacted]w[redacted]d  4 lb 5 oz (1.956 kg) M Vag-Spont   Y      Comments: Induced, PreEclampsia            3. INDUCTION      4.   Patient Active Problem List    Diagnosis Date Noted   ??? IUGR (11%) 07/13/2013     Priority: High   ??? Oligohydramnios (6.35) 07/10/2013     Priority: High   ??? Cervical shortening, antepartum condition or complication 03/19/2013 2.81 cm  03/05/2013     Priority: High     Referral for 17 P pt declined per MFM  CL every 1-2 weeks until 32 weeks  Follow up in 2 weeks 03/19/2013 per MFM     ??? Circumvallate placenta visualized on 03/05/2013 03/05/2013     Priority: High     Growth scans every 3-4 weeks starting at 24 weeks  32 week antenatal testing     ??? Non-compliant pregnant patient 02/03/2013     Priority: High   ???  History of preterm labor, current pregnancy 01/23/2013     Priority: High     01/23/2013 CL every 1-2 weeks between -32 weeks  17 P to start 16-36 weeks  RTO in 5 weeks per MFM     ??? Grand multipara 01/06/2013     Priority: High   ??? Fetal demise, less than 22 weeks 01/06/2013     Priority: High   ??? Smokes 01/06/2013     Priority: High     Discussed smoking cessation     ??? History of gonorrhea      Priority: Medium   ??? History of trichomoniasis      Priority: Medium   ??? Varicosities      Priority: Low     LEGS, WORSE WHEN PREGNANT     ??? Depression      Priority: Low     BIPOLAR, SEVERE DEPRESSION ANXIETY, Ordered to go  to counseling but refuses admission, reports ? suicidal  Attempts in pass, refuses medication.     ??? Cervical shortening, antepartum condition or complication 07/10/2013   ??? Circumvallate placenta 07/03/2013   ??? Poor fetal growth, affecting management of mother, antepartum condition or complication 07/03/2013   ??? Feeling pelvic pressure in pregnancy, antepartum 04/07/2013   ??? Circumvallate placenta in second trimester 03/19/2013   ??? Unspecified antepartum hemorrhage, antepartum 03/05/2013   ??? Subchorionic hematoma in first trimester, antepartum 01/23/2013   ??? Pregnancy with other poor obstetric history(V23.49) 01/23/2013       5. IOL per MFM recommendation for IUGR/ Oligohydramnios          Plan:   1. Continue with current care plan  2. C/W cervidil IOL. Risk of IOL/ pitocin/ pitocin reviewed.         Electronically signed by Manson PasseyMarie M Kelina Beauchamp, DO on 07/14/2013 at 8:34 AM

## 2013-07-14 NOTE — Other (Signed)
Pt resting with eyes shut comfortably

## 2013-07-14 NOTE — Other (Signed)
Pt awake and reports feeling some cramping at times.

## 2013-07-14 NOTE — Other (Signed)
Following SVE patient educated on positioning to help with laboring process.  Patient sitting up in rocking chair, bedside table and call light within reach.

## 2013-07-14 NOTE — Progress Notes (Signed)
Labor Note  Date: 07/14/2013         Time: 7:24 PM    Subjective/Objective  Brittney [name] is a 28 y.o. female at 9175w4d  The patient was seen and examined.   Her pain  is not well controlled.  Pt requesting Nubain.  Does not want epidural    Blood pressure 125/61, pulse 79, temperature 98.2 ??F (36.8 ??C), temperature source Oral, resp. rate 16, height 5\' 4"  (1.626 m), weight 136 lb (61.689 kg), last menstrual period 10/31/2012.    FHT: 135, moderate Variability  Accels: present  Decels: absent  Contractions: every 2-5 minutes  Cervical Exam: 3 cm dilated, 50 effaced, +1 station  Pitocin @ 7 mu/min    Membranes Are: Intact  Scalp Electrode in place: No  Intrauterine Pressure Catheter in Place: No    Assessment/Plan:  1. Brittney [name] is a 28 y.o. female [phone] 2875w4d   2. GBS Unknown   -Pen Gfor GBS prophylaxis  3. IOL secondary to IUGR and Low AFI for GA   -S/P Cervidil   -Pitocin per protocol   -Nubain for pain  4. Continue management of IOL    Dr. Olevia BowensMorelli updated.     Mack HookLandon Adithi Gammon D.O.

## 2013-07-14 NOTE — Other (Signed)
Cervidil removed.  Patient up and in the shower.  Patient instructed to use the call light when finished with the shower.

## 2013-07-14 NOTE — Progress Notes (Signed)
Date: 07/14/2013         Time: 12:04 PM    Brittney [name] is a 28 y.o. female at 2754w4d  The patient was seen and examined.   Pt resting without epidural.    Vitals:  Filed Vitals:    07/14/13 0500 07/14/13 0700 07/14/13 0805 07/14/13 0821   BP: 119/60 135/86 126/77    Pulse: 74 101 94 94   Temp:    98.3 ??F (36.8 ??C)   TempSrc:    Oral   Resp: 16 16  16    Height:       Weight:             FHT: 140, moderate Variability  Accels: present  Decels: absent  Contractions: regular, every 5 minutes  Cervical Exam: 2 cm dilated, 25 effaced, +1 station  Pitocin @ 0 mu/min    Membranes Are: Intact  Scalp Electrode in place: No  Intrauterine Pressure Catheter in Place: No    Assessment/Plan:  1. Brittney [name] is a 28 y.o. female [phone] 9954w4d   2. GBS unk   -PCN G for GBS prophylaxis  3. Will leave cervidil in at this time and cont to monitor

## 2013-07-14 NOTE — Other (Signed)
Cervidil placed per writer.

## 2013-07-14 NOTE — Progress Notes (Signed)
Labor Note  Date: 07/14/2013         Time: 11:03 PM    Subjective/Objective  Domingo PulseShalonda L Allie is a 28 y.o. female at 7543w4d  The patient was seen and examined.   Her pain  is well controlled by nubain.  AROM without difficulty.    Blood pressure 119/60, pulse 79, temperature 97.9 ??F (36.6 ??C), temperature source Oral, resp. rate 18, height 5\' 4"  (1.626 m), weight 136 lb (61.689 kg), last menstrual period 10/31/2012.    FHT: 135, moderate Variability  Accels: present  Decels: absent  Contractions: every 2-3 minutes  Cervical Exam: 3 cm dilated, 50 effaced, +1 station  Pitocin @ 12 mu/min    Membranes Are: Ruptured clear fluid  Scalp Electrode in place: No  Intrauterine Pressure Catheter in Place: No    Assessment/Plan:  1. Domingo PulseShalonda L Wilbon is a 28 y.o. female [phone] 2543w4d   2. GBS Unknown   -Pen Gfor GBS prophylaxis  3. IOL secondary to IUGR and Low AFI for GA   -S/P Cervidil   -Pitocin per protocol   -Nubain for pain  4. Continue management of IOL    Dr. Olevia BowensMorelli updated.     Mack HookLandon Keishla Oyer D.O.

## 2013-07-14 NOTE — Other (Signed)
Patient vomited again, but is better afterwards. RN did not witness this but boyfriend helped patient

## 2013-07-14 NOTE — Other (Signed)
Patient requests to see resident regarding when Dr Olevia BowensMorelli will be here to break her water. Dr. Omer JackKrautkramer goes into see patient and updates her that [facility] and will call when she is finished to see how patient is doing. For now, patient was informed about continuing with Pitocin. Risks of ROM too soon was also discussed with patient.  Fleet ContrasB. Chandi Nicklin, RN

## 2013-07-14 NOTE — Other (Signed)
Patient medicated with Nubain as ordered. Will continue to monitor for relief. Offered patient change of position or to sit up in rocking chair but pt refuses at this time. Fleet ContrasB. Angeli Demilio, RN

## 2013-07-14 NOTE — Progress Notes (Signed)
Labor & Progress Note  Date: 07/14/2013         Time: 4:39 AM    Subjective/Objective  Domingo PulseShalonda L Ahles is a 28 y.o. female at 4725w4d  The patient was seen and examined.   Pt is feeling abdominal cramping and lower back pain.  Feeling occasional contractions.     Blood pressure 117/78, pulse 96, temperature 97.9 ??F (36.6 ??C), temperature source Oral, resp. rate 16, height 5\' 4"  (1.626 m), weight 136 lb (61.689 kg), last menstrual period 10/31/2012.    FHT: 130, moderate Variability  Accels: present  Decels: absent  Contractions: occasional with irritability  Cervical Exam: Deferred    Membranes Are: Intact  Scalp Electrode in place: No  Intrauterine Pressure Catheter in Place: No    Assessment/Plan:  1. Domingo PulseShalonda L Grosch is a 28 y.o. female [phone] 725w4d   2. GBS Unknown   -Pen G for GBS prophylaxis   -GBS collected  3. IOL secondary to IUGR and Low AFI for GA   -Cervidil placed at 0200  4.  Continue management of IOL    [employer] OmahaLandon Alesandro Stueve D.O.

## 2013-07-14 NOTE — Other (Addendum)
Patient up to BR to void and encouraged to sit in rocking chair, patient agreeable. Patient then vomited moderate amount of undigested food, zofran offered and given to patient. Pitocin increased to 3011mu/min.  Will continue to monitor.  Fleet ContrasB. Zackarie Chason, RN

## 2013-07-14 NOTE — Progress Notes (Signed)
Labor Note  Date: 07/14/2013         Time: 9:00 PM    Subjective/Objective  Brittney [name] is a 28 y.o. female at 11039w4d  The patient was seen and examined.   Her pain  is well controlled by nubain.    Blood pressure 133/71, pulse 84, temperature 97.9 ??F (36.6 ??C), temperature source Oral, resp. rate 18, height 5\' 4"  (1.626 m), weight 136 lb (61.689 kg), last menstrual period 10/31/2012.    FHT: 125, moderate Variability  Accels: absent  Decels: absent  Contractions: every 2-4 minutes  Cervical Exam: deferred  Pitocin @ 9 mu/min    Membranes Are: Intact  Scalp Electrode in place: No  Intrauterine Pressure Catheter in Place: No    Assessment/Plan:  1. Brittney [name] is a 28 y.o. female [phone] 6039w4d   2. GBS Unknown   -Pen Gfor GBS prophylaxis  3. IOL secondary to IUGR and Low AFI for GA   -S/P Cervidil   -Pitocin per protocol   -Nubain for pain  4. Continue management of IOL    [employer] OmahaLandon Nakyah Erdmann D.O.

## 2013-07-14 NOTE — Progress Notes (Addendum)
Labor Note  Date: 07/14/2013         Time: 11:46 PM    Subjective/Objective  Brittney [name] is a 28 y.o. female at 7468w4d  The patient was seen and examined.   Her pain  is well controlled by nubain    Blood pressure 128/78, pulse 96, temperature 97.9 ??F (36.6 ??C), temperature source Oral, resp. rate 18, height 5\' 4"  (1.626 m), weight 136 lb (61.689 kg), last menstrual period 10/31/2012.    FHT: 130, moderate Variability  Accels: present  Decels: absent  Contractions: every 2-4 minutes  Cervical Exam: 3-4 cm dilated, 50 effaced, +1 station  Pitocin @ 12 mu/min    Membranes Are: Ruptured clear fluid  Scalp Electrode in place: No  Intrauterine Pressure Catheter in Place: No    Assessment/Plan:  1. Brittney [name] is a 28 y.o. female [phone] 7468w4d   2. GBS Unknown   -Pen Gfor GBS prophylaxis  3. IOL secondary to IUGR and Low AFI for GA   -S/P Cervidil   -Pitocin per protocol   -Nubain for pain  4. Continue management of IOL    Dr. Olevia BowensMorelli updated.     Mack HookLandon Dearius Hoffmann D.O.

## 2013-07-14 NOTE — Other (Signed)
Patient resting in bed upon entering.  Baby's father visiting.  Patient states she is feeling uncomfortable but does not wish to have any medications at this time.  Patient rates pain at a 6 out of 10. Patient up to use the bathroom.  Patient resting comfortably in bed upon leaving. Ice pack given for left arm discomfort during PCN infusion.

## 2013-07-14 NOTE — Other (Addendum)
AROM per Dr Omer JackKrautkramer. Patient tolerated procedure well. Clear fluid, moderate amount. FHT 125.  Patient then positioned on right side with pillow between her legs.  Fleet ContrasB. Ruari Mudgett, RN

## 2013-07-14 NOTE — Other (Signed)
Patient sitting at bedside in rocking chair. Quiet and reserved demeanor now tearful. Multiple attempts by RN today to connect with patient emotionally and establish rapport. Support given patient states "I just don't want to talk right now". No family or friends currently visiting.

## 2013-07-15 LAB — BLOOD GAS, CORD BLOOD
HCO3, Cord Art: 24.9 mmol/L — ABNORMAL LOW (ref 29–39)
HCO3, Cord Ven: 22.3 mmol/L (ref 20–32)
Negative Base Excess, Cord, Art: 3 mmol/L — ABNORMAL HIGH (ref 0.0–2.0)
Negative Base Excess, Cord, Ven: 3 mmol/L — ABNORMAL HIGH (ref 0.0–2.0)
pCO2, Cord Art: 56.9 mmHg — ABNORMAL HIGH (ref 40–50)
pCO2, Cord Ven: 44.2 mmHg — ABNORMAL HIGH (ref 28.0–40.0)
pH, Cord Art: 7.264 — ABNORMAL LOW (ref 7.30–7.40)
pH, Cord Ven: 7.324 — ABNORMAL LOW (ref 7.35–7.45)
pO2, Cord Art: 18.5 mmHg (ref 15–25)
pO2, Cord Ven: 26.6 mmHg (ref 21.0–31.0)

## 2013-07-15 MED ORDER — IBUPROFEN 600 MG PO TABS
600 | Freq: Four times a day (QID) | ORAL | Status: DC | PRN
Start: 2013-07-15 — End: 2013-07-18
  Administered 2013-07-15 – 2013-07-18 (×9): 600 mg via ORAL

## 2013-07-15 MED ADMIN — lactated ringers infusion: INTRAVENOUS | @ 14:00:00 | NDC 00338011704

## 2013-07-15 MED ADMIN — ceFAZolin (ANCEF) IVPB: INTRAVENOUS | @ 08:00:00 | NDC 09999990618

## 2013-07-15 MED ADMIN — fentaNYL 2mcg/mL ropivacaine 0.15% in sodium chloride 0.9% 200mL (OB) epidural: 12 | EPIDURAL | @ 07:00:00 | NDC 09999990414

## 2013-07-15 MED ADMIN — citric acid-sodium citrate (BICITRA) 334-500 MG/5ML solution: ORAL | @ 08:00:00 | NDC 00121059530

## 2013-07-15 MED ADMIN — famotidine (PEPCID) injection 20 mg: 20 mg | INTRAVENOUS | @ 14:00:00 | NDC 00641602201

## 2013-07-15 MED ADMIN — diphenhydrAMINE (BENADRYL) injection 25 mg: 25 mg | INTRAVENOUS | @ 20:00:00 | NDC 00641037621

## 2013-07-15 MED ADMIN — lactated ringers infusion: INTRAVENOUS | @ 05:00:00 | NDC 00338011704

## 2013-07-15 MED ADMIN — ketorolac (TORADOL) injection 30 mg: 30 mg | INTRAVENOUS | @ 11:00:00 | NDC 00409379501

## 2013-07-15 MED ADMIN — nalbuphine (NUBAIN) injection 10 mg: 10 mg | INTRAVENOUS | @ 05:00:00 | NDC 00409146301

## 2013-07-15 MED ADMIN — ondansetron (ZOFRAN) injection 4 mg: 4 mg | INTRAVENOUS | @ 02:00:00 | NDC 00409475503

## 2013-07-15 MED ADMIN — penicillin G potassium 2.5 Million Units in dextrose 5 % 100 mL IVPB: 2.5 10*6.[IU] | INTRAVENOUS | @ 02:00:00 | NDC 00264151032

## 2013-07-15 MED ADMIN — diphenhydrAMINE (BENADRYL) injection 25 mg: 25 mg | INTRAVENOUS | @ 14:00:00 | NDC 00641037621

## 2013-07-15 MED ADMIN — ketorolac (TORADOL) injection 30 mg: 30 mg | INTRAVENOUS | @ 17:00:00 | NDC 00409379501

## 2013-07-15 MED FILL — DIPHENHYDRAMINE HCL 50 MG/ML IJ SOLN: 50 MG/ML | INTRAMUSCULAR | Qty: 1

## 2013-07-15 MED FILL — KETOROLAC TROMETHAMINE 30 MG/ML IJ SOLN: 30 MG/ML | INTRAMUSCULAR | Qty: 1

## 2013-07-15 MED FILL — FENTANYL 2MCG/ML AND ROPIVACAINE 0.15 % (OB) EPIDURAL: Qty: 200

## 2013-07-15 MED FILL — METRONIDAZOLE 500 MG PO TABS: 500 MG | ORAL | Qty: 1

## 2013-07-15 MED FILL — PRENAPLUS 27-1 MG PO TABS: 27-1 MG | ORAL | Qty: 1

## 2013-07-15 MED FILL — DOK 100 MG PO CAPS: 100 MG | ORAL | Qty: 1

## 2013-07-15 MED FILL — CITRIC ACID-SODIUM CITRATE 334-500 MG/5ML PO SOLN: 334-500 MG/5ML | ORAL | Qty: 30

## 2013-07-15 MED FILL — OXYTOCIN 30 UNITS IN 500 ML INFUSION: 30 UNIT/500ML | INTRAVENOUS | Qty: 500

## 2013-07-15 MED FILL — NALBUPHINE HCL 10 MG/ML IJ SOLN: 10 MG/ML | INTRAMUSCULAR | Qty: 1

## 2013-07-15 MED FILL — SEROQUEL XR 50 MG PO TB24: 50 MG | ORAL | Qty: 1

## 2013-07-15 MED FILL — FAMOTIDINE 20 MG/2ML IV SOLN: 20 MG/2ML | INTRAVENOUS | Qty: 2

## 2013-07-15 MED FILL — PFIZERPEN-G 5000000 UNITS IJ SOLR: 5000000 units | INTRAMUSCULAR | Qty: 5

## 2013-07-15 MED FILL — ONDANSETRON HCL 4 MG/2ML IJ SOLN: 4 MG/2ML | INTRAMUSCULAR | Qty: 2

## 2013-07-15 MED FILL — IBUPROFEN 600 MG PO TABS: 600 MG | ORAL | Qty: 1

## 2013-07-15 MED FILL — CEFAZOLIN 2000 MG D5W 50 ML IVPB: Qty: 2

## 2013-07-15 MED FILL — FAMOTIDINE 20 MG PO TABS: 20 MG | ORAL | Qty: 1

## 2013-07-15 NOTE — Anesthesia Post-Procedure Evaluation (Signed)
Anesthesia Post-op Note    Patient: Brittney [name] PulseShalonda L Casebolt    Procedure(s) Performed: c section    POST-OP ANESTHESIA NOTE       BP 137/64    Pulse 92    Temp(Src) 98.1 ??F (36.7 ??C) (Oral)    Resp 18    Ht 5\' 4"  (1.626 m)    Wt 136 lb (61.689 kg)    BMI 23.33 kg/m2      SpO2 99%    LMP 10/31/2012          Pain Adequately Treated: Following, treating        Nausea / Vomiting:  []   Present   [x]    Absent       Hydration:   [x]  Adequate   []   Not Adequate_________________________________       Cardiovascular Status:  Stable- [x]    YES   []   NO______________________________       Respiratory Function: Stable- [x]    YES   []   NO________________________________      Mental Status/LOC:  Stable      Complications:  None       Follow Up Care:  None      Other/Instructions:       Electronically signed by Nunzio CobbsSaebom Johneisha Broaden, MD on 07/15/2013 at 4:41 AM      Nunzio CobbsSaebom Jacqlyn Marolf, MD  4:41 AM

## 2013-07-15 NOTE — Other (Signed)
To NICU per wheelchair to visit infant

## 2013-07-15 NOTE — Other (Signed)
Peri care provided with pt assist.  Tolerated well.  Pt transferred to 7c per bed

## 2013-07-15 NOTE — Progress Notes (Signed)
Pt reexamined secondary to variable decels with occasional late decelerations. FHR decreases to 70s with positional changes. Pt had a 2 minute deceleration. Pitocin off. Contractions irregular. Cervix remains   4-5/ 70%/0 OP. Unable to restart pitocin due to decelerations. Discussed with pt and she is requesting primary c-section at this time. Cervix unchanged an unable to restart pitocin. Anesthesia / NICU notified.

## 2013-07-15 NOTE — Progress Notes (Signed)
Date: 07/15/2013         Time: 3:43 AM    Brittney [name] is a 28 y.o. female at 2010w5d  The patient was seen and examined.   Her pain  Is more controlled by epidural.  She is still feeling contractions.  Called to room for prolonged deceleration. Dr. Omer JackKrautkramer at bedside. FSE placed without difficulty.    Recover of fetal heart tones with position change, pitocin discontinued, and fluid bolus.    She has had recurrent early decelerations with occasional variable and late decelerations resolved with uterine resuscitation measures.    Blood pressure 137/64, pulse 92, temperature 98.1 ??F (36.7 ??C), temperature source Oral, resp. rate 16, height 5\' 4"  (1.626 m), weight 136 lb (61.689 kg), last menstrual period 10/31/2012, SpO2 99 %, not currently breastfeeding.    FHT: 130, moderate Variability  Accels: present  Decels: Prolonged for 6 minutes down to 60bpm return to baseline  Contractions: regular, every 3-4 minutes  Cervical Exam: 5 cm dilated, 90 effaced, +1 station, cervical position anterior, consistency soft per Dr. Omer JackKrautkramer  Pitocin turned off    Membranes Are: Ruptured   Scalp Electrode in place: Yes  Intrauterine Pressure Catheter in Place: Yes    Assessment/Plan:  1. Brittney PulseShalonda L [name] is a 28 y.o. female [phone] [id number] 2010w5d   2. GBS unknown   -Pen G for GBS prophylaxis  3. MFM recommended IOL for oligohydramnios and IUGR  4. Continue management of IOL   - Pitocin off, FSE in place.  Reassuring at this time.     - Dr. Olevia BowensMorelli present to evaluate patient   - Continue expectant management with close observation    Carmin RichmondRoost, Nance Mccombs  07/15/2013  3:50 AM

## 2013-07-15 NOTE — Anesthesia Pre-Procedure Evaluation (Signed)
Brittney [name]    Department of Anesthesiology  Pre-Anesthesia Evaluation/Consultation         Name:  Brittney [name]                                         Age:  28 y.o.  MRN:  [id number]           Procedure (Scheduled):    Surgeon:  Dr.      Forrestine HimAllergies   Allergen Reactions   ??? Other Hives     17-p injection     Patient Active Problem List   Diagnosis   ??? History of trichomoniasis   ??? Varicosities   ??? Depression   ??? History of gonorrhea   ??? Grand multipara   ??? Fetal demise, less than 22 weeks   ??? Smokes   ??? Subchorionic hematoma in first trimester, antepartum   ??? History of preterm labor, current pregnancy   ??? Pregnancy with other poor obstetric history(V23.49)   ??? Non-compliant pregnant patient   ??? Cervical shortening, antepartum condition or complication 03/19/2013 2.81 cm    ??? Circumvallate placenta visualized on 03/05/2013   ??? Unspecified antepartum hemorrhage, antepartum   ??? Circumvallate placenta in second trimester   ??? Feeling pelvic pressure in pregnancy, antepartum   ??? Circumvallate placenta   ??? Poor fetal growth, affecting management of mother, antepartum condition or complication   ??? Cervical shortening, antepartum condition or complication   ??? Oligohydramnios (6.35)   ??? IUGR (11%)     Past Medical History   Diagnosis Date   ??? History of trichomoniasis 03/29/10   ??? 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 Depression      BIPOLAR, SEVERE DEPRESSION ANXIETY   ??? Mild preeclampsia delivered 05,     ELEVATED BP IN EVERY PREGNANCY   ??? Left fetal pyelectasis, antepartum 02/14/2011   ??? Mild preeclampsia delivered    ??? History of gonorrhea 05/30/12     tx'd Rocephin 250 mg IM 05/30/12     No past surgical history on file.  History   Substance Use Topics   ??? Smoking status: Current Every Day Smoker -- 0.50 packs/day for 7 years   ??? Smokeless tobacco: Never Used      Comment: "<1/2pk/day"   ??? Alcohol Use: No     Medications  No current facility-administered medications on file prior to encounter.     Current  Outpatient Prescriptions on File Prior to Encounter   Medication Sig Dispense Refill   ??? Diphenhydramine-APAP, sleep, (TYLENOL PM EXTRA STRENGTH PO) Take  by mouth daily as needed.       ??? SEROQUEL XR 50 MG XR tablet     0   ??? famotidine (PEPCID) 20 MG tablet Take 1 tablet by mouth daily.  30 tablet  0   ??? ondansetron (ZOFRAN) 4 MG tablet Take 1 tablet by mouth every 8 hours as needed for Nausea.  30 tablet  1   ??? Prenatal Vitamins (DIS) TABS Take 1 tablet by mouth daily.  30 tablet  12   ??? metroNIDAZOLE (FLAGYL) 500 MG tablet Take 1 tablet by mouth 2 times daily for 7 days.  14 tablet  0   ??? cephALEXin (KEFLEX) 500 MG capsule Take 1 capsule by mouth 4 times daily for 10 days.  40 capsule  0  Current Facility-Administered Medications   Medication Dose Route Frequency Provider Last Rate Last Dose   ??? fentaNYL 642mcg/mL ropivacaine 0.15% in sodium chloride 0.9% 200mL (OB) epidural            ??? fentaNYL 352mcg/mL ropivacaine 0.15% in sodium chloride 0.9% 200mL (OB) epidural  12 mL/hr Epidural Continuous Nunzio CobbsSaebom Cami Delawder, MD       ??? naloxone (NARCAN) injection 0.4 mg  0.4 mg Intravenous PRN Nunzio CobbsSaebom Marks Scalera, MD       ??? ondansetron (ZOFRAN) injection 4 mg  4 mg Intravenous Q6H PRN Nunzio CobbsSaebom Baldomero Mirarchi, MD       ??? ondansetron [facility](ZOFRAN) injection 4 mg  4 mg Intravenous Q6H PRN Isla PenceKaitlyn Brunner, DO   4 mg at 07/14/13 2155   ??? oxytocin (PITOCIN) 30 units in 500 mL infusion  1 milli-units/min Intravenous Continuous Ala DachLindsay A Parker, DO 16 mL/hr at 07/15/13 0230 16 milli-units/min at 07/15/13 0230   ??? famotidine (PEPCID) tablet 20 mg  20 mg Oral Daily John Roost, DO   20 mg at 07/14/13 0910   ??? PRENATAL VITAMINS PLUS 27-1 MG tablet 1 tablet  1 tablet Oral Daily John Roost, DO   1 tablet at 07/14/13 [id number]   ??? QUEtiapine (SEROQUEL XR) XR tablet 50 mg  50 mg Oral Nightly John Roost, DO       ??? lactated ringers infusion   Intravenous Continuous John Roost, DO 125 mL/hr at 07/15/13 0059     ??? sodium chloride flush 0.9 % injection 10 mL  10 mL Intravenous  Q12H [facility] John Roost, DO       ??? sodium chloride flush 0.9 % injection 10 mL  10 mL Intravenous PRN John Roost, DO       ??? acetaminophen (TYLENOL) tablet 650 mg  650 mg Oral Q4H PRN John Roost, DO       ??? docusate sodium (COLACE) capsule 100 mg  100 mg Oral BID John Roost, DO       ??? penicillin G potassium 2.5 Million Units in dextrose 5 % 100 mL IVPB  2.5 Million Units Intravenous Q4H John Roost, DO 200 mL/hr at 07/14/13 2155 2.5 Million Units at 07/14/13 2155     Vital Signs (Current)   Filed Vitals:    07/15/13 0206   BP: 136/82   Pulse: 81   Temp:    Resp: 18     Vital Signs Statistics (for past 48 hrs)     BP  Min: 112/62   Min taken time: 07/15/13 0130  Max: 136/82   Max taken time: 07/15/13 0206  Temp  Avg: 98.1 ??F (36.7 ??C)  Min: 97.9 ??F (36.6 ??C)   Min taken time: 07/14/13 2000  Max: 98.3 ??F (36.8 ??C)   Max taken time: 07/14/13 0821  Pulse  Avg: 85.2  Min: 74   Min taken time: 07/14/13 2032  Max: 101   Max taken time: 07/14/13 0700  Resp  Avg: 17.1  Min: 16   Min taken time: 07/15/13 0000  Max: 18   Max taken time: 07/15/13 0206    BP Readings from Last 3 Encounters:   07/15/13 136/82   07/10/13 123/70   07/08/13 129/77     BMI  Body mass index is 23.33 kg/(m^2).  Estimated body mass index is 23.33 kg/(m^2) as calculated from the following:    Height as of this encounter: 5\' 4"  (1.626 m).    Weight as of this encounter: 136 lb (61.689 kg).  CBC   Lab Results   Component Value Date    WBC 8.9 07/13/2013    WBC 10.1 03/30/2010    RBC 3.61 07/13/2013    RBC 3.80 03/30/2010    HGB 11.1 07/13/2013    HCT 32.0 07/13/2013    MCV 88.8 07/13/2013    RDW 13.4 07/13/2013    PLT 238 07/13/2013    PLT 259 03/30/2010     CMP    Lab Results   Component Value Date    NA 134 09/10/2010    K 3.8 09/10/2010    CL 105 09/10/2010    CO2 24 09/10/2010    BUN 8 09/10/2010    CREATININE 0.58 09/10/2010    GFRAA >60 09/10/2010    LABGLOM >60 09/10/2010    GLUCOSE 86 06/19/2013    GLUCOSE 75 06/19/2013    CALCIUM 9.9 09/10/2010     BMP    Lab Results    Component Value Date    NA 134 09/10/2010    K 3.8 09/10/2010    CL 105 09/10/2010    CO2 24 09/10/2010    BUN 8 09/10/2010    CREATININE 0.58 09/10/2010    CALCIUM 9.9 09/10/2010    GFRAA >60 09/10/2010    LABGLOM >60 09/10/2010    GLUCOSE 86 06/19/2013    GLUCOSE 75 06/19/2013     POC Testing  No results found for this basename: POCGLU, POCNA, POCK, POCCL, POCBUN, POCHEMO, POCHCT,  in the last 72 hours    Coags    No results found for this basename: PROTIME, INR, APTT     HCG (If Applicable)   No results found for this basename: PREGTESTUR, PREGSERUM, HCG, HCGQUANT      ABGs   No results found for this basename: PHART, PO2ART, PCO2ART, HCO3ART, BEART, O2SATART      Type & Screen (If Applicable)  No results found for this basename: LABABO, [facility]       Radiology (If Applicable)  Cardiac Testing (If Applicable)   EKG (If Applicable)        Anesthesia Evaluation      Airway   Dental      Pulmonary - normal exam   (-) shortness of breath  Cardiovascular   (+) hypertension,   (-) angina    Rate: normal    Neuro/Psych    GI/Hepatic/Renal    (-) renal disease    Endo/Other    (-) blood dyscrasia  Abdominal                   Allergies: Other    NPO Status:                                                        Anesthesia Plan    ASA 2     epidural     Anesthetic plan and risks discussed with patient.          Nunzio Cobbs, MD  07/15/2013

## 2013-07-15 NOTE — Progress Notes (Signed)
Patient Name: Brittney [name]  Patient DOB: 1985-05-28  Room/Bed: 0707/0707-01  Admission Date/Time: 07/13/2013  8:29 PM    Date: 07/15/2013  Time: 12:53 AM        Brittney [name] is a 28 y.o. female  Obstetric History    G7   P6   T2   P4   A0   TAB0   SAB0   E0   M0   L5       # Outcome Date GA Lbr Len/2nd Weight Sex Delivery Anes PTL Lv   7 Current            6 Term 03/22/11 [redacted]w[redacted]d  5 lb 5 oz (2.41 kg) F Vag-Spont  N Y   5 Preterm 03/30/10 [redacted]w[redacted]d  7.9 oz (0.225 kg) M Vag-Spont  Y ND   4 Term 06/22/09 [redacted]w[redacted]d  6 lb 8 oz (2.948 kg) F Vag-Spont   Y   3 Preterm 08/28/07 [redacted]w[redacted]d  4 lb 1 oz (1.843 kg) F Vag-Spont  Y Y   2 Preterm 07/21/06 [redacted]w[redacted]d  6 lb 5 oz (2.863 kg) M Jerral Ralph   1 Preterm 12/02/03 [redacted]w[redacted]d  4 lb 5 oz (1.956 kg) M Vag-Spont   Y          Gestational Age:  [redacted]w[redacted]d      The patient was seen and examined. The details of her pelvic exam can be found in the EPIC flow section of her EMR. Her pain  is not well controlled. Pt requesting more nubain.  The baby is moving well.    Tracing Review (Date of Tracing):  There Is moderate Variability  Filed Vitals:    07/14/13 2300 07/14/13 2330 07/15/13 0000 07/15/13 0030   BP: 125/71 128/78 123/75 125/70   [name]: 78 96 84 77   Temp:   98.2 ??F (36.8 ??C)    TempSrc:   Oral    Resp: 18 18 16     Height:       Weight:         FHT's are 130  The tracing is Category 1 .    Intervention:  None      Membranes Are: Ruptured clear fluid  Scalp Electrode in place: No  Intrauterine Pressure Catheter in Place: No          4 Week Lab Review:  Admission on 07/13/2013   Component Date Value Range Status   ??? WBC 07/13/2013 8.9  3.5 - 11.0 k/uL Final   ??? RBC 07/13/2013 3.61* 4.0 - 5.2 m/uL Final   ??? Hemoglobin 07/13/2013 11.1* 12.0 - 16.0 g/dL Final   ??? Hematocrit 07/13/2013 32.0* 36 - 46 % Final   ??? MCV 07/13/2013 88.8  80 - 100 fL Final   ??? MCH 07/13/2013 30.8  26 - 34 pg Final   ??? MCHC 07/13/2013 34.7  31 - 37 g/dL Final   ??? RDW 16/01/9603 13.4  12.5 - 15.4 % Final   ??? Platelets  07/13/2013 238  140 - 450 k/uL Final   ??? MPV 07/13/2013 9.2  6.0 - 12.0 fL Final   ??? Differential Type 07/13/2013 NOT REPORTED   Final   ??? Seg Neutrophils 07/13/2013 61  36 - 66 % Final   ??? Lymphocytes 07/13/2013 33  24 - 44 % Final   ??? Monocytes 07/13/2013 5  2 - 11 % Final   ??? Eosinophils Relative Percent 07/13/2013 1  1 - 4 % Final   ???  Basophils 07/13/2013 0  0 - 2 % Final   ??? Segs Absolute 07/13/2013 5.30  1.8 - 7.7 k/uL Final   ??? Absolute Lymph # 07/13/2013 3.00  1.0 - 4.8 k/uL Final   ??? Absolute Mono # 07/13/2013 0.50  0.1 - 1.2 k/uL Final   ??? Absolute Eos # 07/13/2013 0.10  0.0 - 0.4 k/uL Final   ??? Basophils Absolute 07/13/2013 0.00  0.0 - 0.2 k/uL Final    [facility] [address], [place] [address] [phone]   ??? WBC Morphology 07/13/2013 NOT REPORTED   Final   ??? RBC Morphology 07/13/2013 NOT REPORTED   Final   ??? Platelet Estimate 07/13/2013 NOT REPORTED   Final   ??? Expiration Date 07/13/2013 07/16/2013   Final   ??? Arm Band Number 07/13/2013 [id number]   Final   ??? ABO/Rh 07/13/2013 O POSITIVE   Final   ??? Antibody Screen 07/13/2013 NEGATIVE   Final    Comment: Performed at [facility] [address],   [place] [address] [phone]     ??? T. Pallidum Ab 07/13/2013 NONREACTIVE  NR Final    Comment:       T. pallidum antibodies are not detected.  There is no serological evidence of infection with T. pallidum (early primary   syphilis cannot be excluded).  Retest in 2-4 weeks if syphilis is clinically   suspect.        [facility] [address], [place] [address] [phone]     ??? Color, UA 07/13/2013 YELLOW  YEL Final   ??? Turbidity UA 07/13/2013 CLOUDY* CLEAR Final   ??? Glucose, Ur 07/13/2013 1+* NEG Final   ??? Bilirubin Urine 07/13/2013 NEGATIVE  NEG Final   ??? Ketones, Urine 07/13/2013 SMALL* NEG Final   ??? Specific Gravity, UA 07/13/2013 1.017  1.005 - 1.030 Final   ??? Urine Hgb 07/13/2013 NEGATIVE  NEG Final   ??? pH, UA 07/13/2013 6.5  5.0 - 8.0 Final   ??? Protein,  UA 07/13/2013 TRACE* NEG Final   ??? Urobilinogen, Urine 07/13/2013 Normal  NORM Final   ??? Nitrite, Urine 07/13/2013 NEGATIVE  NEG Final   ??? Leukocyte Esterase, Urine 07/13/2013 SMALL* NEG Final   ??? Urinalysis Comments 07/13/2013 Culture ordered based on defined criteria.   Corrected    [facility] [address] [place] [address] [phone]   ??? - 07/13/2013        Final   ??? WBC, UA 07/13/2013 10 TO 20  0 - 5 /HPF Final   ??? RBC, UA 07/13/2013 0 TO 2  0 - 2 /HPF Final   ??? Casts UA 07/13/2013 NOT REPORTED  0 - 2 /LPF Final   ??? Crystals UA 07/13/2013 NOT REPORTED  NONE /HPF Final   ??? Epithelial Cells UA 07/13/2013 10 TO 20  0 - 5 /HPF Final   ??? Renal Epithelial, Urine 07/13/2013 NOT REPORTED  0 /HPF Final   ??? Bacteria, UA 07/13/2013 FEW* NONE Final   ??? Mucus, UA 07/13/2013 1+* NONE Final    [facility] [address], [place] [address] [phone]   ??? Trichomonas, UA 07/13/2013 NOT REPORTED  NONE Final   ??? Amorphous, UA 07/13/2013 NOT REPORTED  NONE Final   ??? Other Observations UA 07/13/2013 NOT REPORTED  NREQ Final   ??? Yeast, UA 07/13/2013 NOT REPORTED  NONE Final   ??? Specimen 07/13/2013 CLEAN CATCH URINE   Final   ??? Special Requests  07/13/2013 NOT REPORTED   Final   ??? Culture 07/13/2013 NO SIGNIFICANT GROWTH   Final   ??? Culture 07/13/2013 [facility] [address] [place], [place] [address] [phone]   Final   ??? Status 07/13/2013 FINAL 07/14/2013   Final   Admission on 07/08/2013, Discharged on 07/09/2013   Component Date Value Range Status   ??? Specimen 07/08/2013 CLEAN CATCH URINE   Final   ??? Special Requests 07/08/2013 NOT REPORTED   Final   ??? Culture 07/08/2013 NO SIGNIFICANT GROWTH   Final   ??? Culture 07/08/2013 [facility] [address] [place], [place] [id number] [phone]   Final   ??? Status 07/08/2013 FINAL 07/09/2013   Final   ??? Color, UA 07/08/2013 YELLOW  YEL Final   ??? Turbidity UA 07/08/2013 CLOUDY* CLEAR Final   ??? Glucose, Ur 07/08/2013 1+* NEG Final   ??? Bilirubin Urine 07/08/2013  NEGATIVE  NEG Final   ??? Ketones, Urine 07/08/2013 NEGATIVE  NEG Final   ??? Specific Gravity, UA 07/08/2013 1.013  1.005 - 1.030 Final   ??? Urine Hgb 07/08/2013 NEGATIVE  NEG Final   ??? pH, UA 07/08/2013 7.0  5.0 - 8.0 Final   ??? Protein, UA 07/08/2013 NEGATIVE  NEG Final   ??? Urobilinogen, Urine 07/08/2013 Normal  NORM Final   ??? Nitrite, Urine 07/08/2013 NEGATIVE  NEG Final   ??? Leukocyte Esterase, Urine 07/08/2013 TRACE* NEG Final    [facility] [address] [place], [place] [address] [phone]   ??? Urinalysis Comments 07/08/2013 NOT REPORTED   Final   ??? - 07/08/2013        Final   ??? WBC, UA 07/08/2013 2 TO 5  0 - 5 /HPF Final   ??? RBC, UA 07/08/2013 0 TO 2  0 - 2 /HPF Final   ??? Casts UA 07/08/2013 NOT REPORTED  0 - 2 /LPF Final   ??? Crystals UA 07/08/2013 NOT REPORTED  NONE /HPF Final   ??? Epithelial Cells UA 07/08/2013 5 TO 10  0 - 5 /HPF Final   ??? Renal Epithelial, Urine 07/08/2013 NOT REPORTED  0 /HPF Final   ??? Bacteria, UA 07/08/2013 FEW* NONE Final    [facility] [address] [place], [place] [id number] [phone]   ??? Mucus, UA 07/08/2013 NOT REPORTED  NONE Final   ??? Trichomonas, UA 07/08/2013 NOT REPORTED  NONE Final   ??? Amorphous, UA 07/08/2013 NOT REPORTED  NONE Final   ??? Other Observations UA 07/08/2013 NOT REPORTED  NREQ Final   ??? Yeast, UA 07/08/2013 NOT REPORTED  NONE Final   Hospital Outpatient Visit on 06/19/2013   Component Date Value Range Status   ??? Hemoglobin A1C 06/19/2013 4.7  4.0 - 6.0 % Final   ??? Estimated Avg Glucose 06/19/2013 88   Final    Comment: The ADA and AACC recommend providing the estimated average glucose result to   permit better patient understanding of their HBA1c result.  Performed at [facility] [address] [place], [place] [id number] [phone]     ??? Hepatitis B Surface Ag 06/19/2013 NONREACTIVE  NR Final    Performed at [facility] [address]. [place], [place] [address] [phone]   ??? Color, UA 06/19/2013 YELLOW  YEL Final   ??? Turbidity UA 06/19/2013 CLEAR  CLEAR Final    ??? Glucose, Ur 06/19/2013 3+* NEG Final   ??? Bilirubin Urine 06/19/2013 NEGATIVE  NEG Final   ??? Ketones, Urine 06/19/2013 NEGATIVE  NEG Final   ??? Specific Gravity,  UA 06/19/2013 1.020  1.000 - 1.030 Final   ??? Urine Hgb 06/19/2013 NEGATIVE  NEG Final   ??? pH, UA 06/19/2013 7.5  5.0 - 8.0 Final   ??? Protein, UA 06/19/2013 NEGATIVE  NEG Final   ??? Urobilinogen, Urine 06/19/2013 Normal  NORM Final   ??? Nitrite, Urine 06/19/2013 NEGATIVE  NEG Final   ??? Leukocyte Esterase, Urine 06/19/2013 NEGATIVE  NEG Final   ??? Urinalysis Comments 06/19/2013 Microscopic exam not performed based on chemical results unless requested in   Final    Comment:  original order.  Performed at [facility] [address] [place], [place] [address]   [phone]     ??? TSH 06/19/2013 0.99  0.30 - 5.00 mIU/L Final    Performed at [facility] [address]. [place], [place] [address] [phone]   ??? WBC 06/19/2013 6.9  3.5 - 11.0 k/uL Final   ??? RBC 06/19/2013 3.62* 4.0 - 5.2 m/uL Final   ??? Hemoglobin 06/19/2013 11.4* 12.0 - 16.0 g/dL Final   ??? Hematocrit 06/19/2013 32.8* 36 - 46 % Final   ??? MCV 06/19/2013 90.7  80 - 100 fL Final   ??? MCH 06/19/2013 31.5  26 - 34 pg Final   ??? MCHC 06/19/2013 34.7  31 - 37 g/dL Final   ??? RDW 84/13/244003/19/2015 13.5  12.5 - 15.4 % Final   ??? Platelets 06/19/2013 208  140 - 450 k/uL Final   ??? MPV 06/19/2013 10.3  6.0 - 12.0 fL Final   ??? Differential Type 06/19/2013 NOT REPORTED   Final   ??? Seg Neutrophils 06/19/2013 64  36 - 66 % Final   ??? Lymphocytes 06/19/2013 28  24 - 44 % Final   ??? Monocytes 06/19/2013 6  2 - 11 % Final   ??? Eosinophils Relative Percent 06/19/2013 1  1 - 4 % Final   ??? Basophils 06/19/2013 1  0 - 2 % Final   ??? Segs Absolute 06/19/2013 4.40  1.8 - 7.7 k/uL Final   ??? Absolute Lymph # 06/19/2013 1.90  1.0 - 4.8 k/uL Final   ??? Absolute Mono # 06/19/2013 0.40  0.1 - 1.2 k/uL Final   ??? Absolute Eos # 06/19/2013 0.10  0.0 - 0.4 k/uL Final   ??? Basophils Absolute 06/19/2013 0.10  0.0 - 0.2 k/uL Final    Performed at  [facility] [address]. [place], [place] [address] [phone]   ??? WBC Morphology 06/19/2013 NOT REPORTED   Final   ??? RBC Morphology 06/19/2013 NOT REPORTED   Final   ??? Platelet Estimate 06/19/2013 NOT REPORTED   Final   ??? Glucose 06/19/2013 75  70 - 99 mg/dL Final    Performed at [facility] [address]. [place], [place] [id number] [phone]   ??? Rubella Antibody, IGG 06/19/2013 100.1   Final    Comment:             REFERENCE RANGE:  <5.0       NON-REACTIVE (non-immune)  5.0 TO 9.9 EQUIVOCAL  >=10.0     REACTIVE     (immune)        NOTE: NEW REFERENCE RANGE  Performed at [facility] [address] [place], [place] [address] [phone]     ??? ABO/Rh 06/19/2013 O POSITIVE   Final   ??? Antibody Screen 06/19/2013 NEGATIVE   Final    Comment: Performed at [facility] [address] [place], [place] [address]   [phone]     ???  HIV 1/2 Antibody 06/19/2013 NONREACTIVE  NR Final    Comment:               Interpretation:         The presence of antibody to HIV and its association with the potential   infectivity, transmission or diagnosis of AIDS has not been established.  Furthermore, a 'Non-Reactive' test result does not exclude the possibility of   exposure to or infection with HIV.  If the above test result is 'Reactive', the Laboratory will order the   confirmatory test.  Performed at [facility] [address] [place], [place] [address] [phone]     ??? T. Pallidum Ab 06/19/2013 NONREACTIVE  NR Final    Comment:       T. pallidum antibodies are not detected.  There is no serological evidence of infection with T. pallidum (early primary   syphilis cannot be excluded).  Retest in 2-4 weeks if syphilis is clinically   suspect.        Performed at [facility] [address], [place] [address] [phone]     ??? GLU ADMN 06/19/2013 Glucola   Final    Comment: Performed at [facility] [address] [place], [place] [address]   [phone]     ??? Glucose tolerance screen 50g 06/19/2013 86  70 -  135 mg/dL Final    Performed at [facility] [address], [place] [address] [phone]   ]    BP 125/70    [name] 77    Temp(Src) 98.2 ??F (36.8 ??C) (Oral)    Resp 16    Ht 5\' 4"  (1.626 m)    Wt 136 lb (61.689 kg)    BMI 23.33 kg/m2      LMP 10/31/2012         Pelvic Exam:  Cervix Check:     DILATION:  4 cm   EFFACEMENT:   70%   STATION:  0 cm   CONSISTENCY:  medium   POSITION:  mid    FETAL POSITION: Cephalic,    Assessment:  1. Brittney [name] is a 28 y.o. female [phone] [redacted]w[redacted]d     2.   OB History   Gravida Para Term Preterm AB SAB TAB Ectopic Multiple Living   7 6 2 4  0 0 0 0 0 5      # Outcome Date GA Lbr Len/2nd Weight Sex Delivery Anes PTL Lv   7 Current            6 Term 03/22/11 [redacted]w[redacted]d  5 lb 5 oz (2.41 kg) F Vag-Spont  N Y   5 Preterm 03/30/10 [redacted]w[redacted]d  7.9 oz (0.225 kg) M Vag-Spont  Y ND      Comments: lived for 1.5 hrs   4 Term 06/22/09 [redacted]w[redacted]d  6 lb 8 oz (2.948 kg) F Vag-Spont   Y   3 Preterm 08/28/07 [redacted]w[redacted]d  4 lb 1 oz (1.843 kg) F Vag-Spont  Y Y   2 Preterm 07/21/06 [redacted]w[redacted]d  6 lb 5 oz (2.863 kg) M Jerral Ralph   1 Preterm 12/02/03 [redacted]w[redacted]d  4 lb 5 oz (1.956 kg) M Vag-Spont   Y      Comments: Induced, PreEclampsia            3. INDUCTION      4.   Patient Active Problem List    Diagnosis Date Noted   ??? IUGR (11%) 07/13/2013     Priority: High   ???  Oligohydramnios (6.35) 07/10/2013     Priority: High   ??? Cervical shortening, antepartum condition or complication 03/19/2013 2.81 cm  03/05/2013     Priority: High     Referral for 17 P pt declined per MFM  CL every 1-2 weeks until 32 weeks  Follow up in 2 weeks 03/19/2013 per MFM     ??? Circumvallate placenta visualized on 03/05/2013 03/05/2013     Priority: High     Growth scans every 3-4 weeks starting at 24 weeks  32 week antenatal testing     ??? Non-compliant pregnant patient 02/03/2013     Priority: High   ??? History of preterm labor, current pregnancy 01/23/2013     Priority: High     01/23/2013 CL every 1-2 weeks between -32 weeks  17 P to start 16-36  weeks  RTO in 5 weeks per MFM     ??? Grand multipara 01/06/2013     Priority: High   ??? Fetal demise, less than 22 weeks 01/06/2013     Priority: High   ??? Smokes 01/06/2013     Priority: High     Discussed smoking cessation     ??? History of gonorrhea      Priority: Medium   ??? History of trichomoniasis      Priority: Medium   ??? Varicosities      Priority: Low     LEGS, WORSE WHEN PREGNANT     ??? Depression      Priority: Low     BIPOLAR, SEVERE DEPRESSION ANXIETY, Ordered to go to counseling but refuses admission, reports ? suicidal  Attempts in pass, refuses medication.     ??? Cervical shortening, antepartum condition or complication 07/10/2013   ??? Circumvallate placenta 07/03/2013   ??? Poor fetal growth, affecting management of mother, antepartum condition or complication 07/03/2013   ??? Feeling pelvic pressure in pregnancy, antepartum 04/07/2013   ??? Circumvallate placenta in second trimester 03/19/2013   ??? Unspecified antepartum hemorrhage, antepartum 03/05/2013   ??? Subchorionic hematoma in first trimester, antepartum 01/23/2013   ??? Pregnancy with other poor obstetric history(V23.49) 01/23/2013       5. IOL secondary to IUGR/ Decreased amniotic fluid per MFM          Plan:   1. Continue with current care plan  2. IOL- Risk of IOL/ cervidil/ pitocin reviewed        Electronically signed by Manson Passey, DO on 07/15/2013 at 12:53 AM

## 2013-07-15 NOTE — Op Note (Signed)
[address] [facility]                 [address], [place], [place]  [phone]                                 OPERATIVE REPORT    Patient: Brittney PulseHAYMAN, Brittney L             Reg#:  [id number]   MRN   [id number]  Birth Date:      09-01-1985  Patient Status:  IA                         Clinic Code:      SVOP  Room:            C7  [id number]                 Adm:              07/13/2013  Sex:             F                          Dis:  Attending:       Alison MurrayMarie Halaina Vanduzer, D.O.      Date of Surgery:  07/15/2013  Dct By:          Alison MurrayMarie Caleah Tortorelli, D.O.  PCP Phys:    Document#:       [id number]:       [id number]                    Job#:             [id number]  Date/Time Typed: 07/15/2013 10:01 A         By:               Tamela Gammoncsb  Date/Time Dct:   07/15/2013    05:10 A      PC                P                                              Facility:         [facility]    SURGEON:  Alison MurrayMarie Raymonde Hamblin, D.O.    ASSISTANT:  Carmin RichmondJohn Roost, D.O.  Mack HookLandon Krautkramer, D.O.    PREOPERATIVE DIAGNOSIS:  1.  Intrauterine pregnancy at 5036 and 5/[redacted] weeks gestation.  2.  Intrauterine growth retardation.  3.  Oligohydramnios.  4.  Grand multiparous.  5.  Noncompliance.  6.  Circumvallate  placenta.  7.  Recurrent decelerations prolonged and late.  8.  Remote from delivery.  9.  Fetal intolerance to Pitocin.    POSTOPERATIVE DIAGNOSIS:  1.   Intrauterine pregnancy at 4536 and 5/[redacted] weeks gestation.  2.   Intrauterine growth retardation.  3.   Oligohydramnios.  4.   Grand multiparous.  5.   Noncompliance.  6.   Circumvallate  placenta.  7.   Recurrent decelerations prolonged and late.  8.   Remote from delivery.  9.   Fetal intolerance to Pitocin.  10. With occiput posterior presentation.    PROCEDURE:  Primary low transverse cesarean section with abdominal delivery of a viable  female neonate.  Apgars 8 at one minute, 9 at five minutes.  Weight 5  pounds 7 ounces.  ESTIMATED BLOOD LOSS:  400 mL.    FLUIDS:  Lactated Ringers 800 mL.    URINE:  Clear yellow, 500  mL.    FINDINGS:  Was that of a viable female neonate in occiput posterior presentation.  No  nuchal cord.  Placenta appeared meconium stained although fluid was very  minimal upon rupture and no fluid seen at time of cesarean section.  Placenta consistent with circumvallate.  Both ovaries and fallopian tubes  visualized appeared within normal limits.    HISTORY:  The patient was being induced secondary to oligohydramnios and IUGR.  It  was recommended by MFM from 34 to 37 and 6.  Patient was 36 and 5/[redacted] weeks  gestation.  Patient underwent a Cervidil followed by Pitocin. Patient was  artificially ruptured.  Patient remained 4 to 5 cm for 5 to 6 hours despite  IUPC with added contractions.  The Pitocin had to be turned off secondary  to prolonged deceleration of 6 minutes.  The patient was given O2,  positional changes, IV fluids.  Patient continued to have occasional late  decelerations and variables.  Patient then had another prolonged  deceleration. Patient was not changing her cervix and Pitocin was unable to  be turned on secondary to the decelerations.  At this time it was decided  to perform a primary cesarean section.    PROCEDURE:  Patient was taken back to the operative suite where she was redosed on her  epidural.  Placed in the supine position.  A wedge placed underneath her  right hip and she was prepped and draped in the usual sterile fashion after  a time-out was performed.  Once an adequate check was performed and the  Foley catheter was previously placed, found to be draining to clear urine,  our first knife is used to make a Pfannenstiel incision, carried down to  the fascia.  Fascia nicked in the midline, carried laterally on both sides.  Dissected free from underlying musculature both superiorly and inferiorly.  Midline muscular was identified and separated.  Underlying peritoneum was  grasped and entered.  This was extended both superiorly and inferiorly  careful not to involve bowel or bladder.   DeLee bladder blade was placed.  Bladder reflection moved off the lower uterine segment using both blunt and  sharp dissection.  DeLee bladder blade was replaced.  Second knife was used  to make a myometrial incision in a low transverse type fashion.  Back end  of the knife was used to enter the uterine cavity.  ________ on both sides.  The fetus was delivered from occiput posterior presentation. No nuchal  cord. No fluid was noted.  Baby was bulb suctioned.  Spontaneous  cryotherapy noted.  Remainder of the body delivered without difficulty.  Cord was clamped and cut.  Baby was passed off to neonatology present at  delivery.  Cord segment was obtained.  Cord blood was attempted.  There was  minimal blood in the cord.  Placenta manually extracted from the uterus.  Placenta was found to be calcified and ratty.  Uterus cleared from any  remaining clots and placental fragments.   Patient was still found to be 5  cm.    Uterus was closed with 0 Vicryl suture in running interlocking  fashion.  Gutters were cleansed.  Both ovaries and fallopian tubes  visualized appeared within normal  limits.  Incision then inspected and  found to be hemostatic.  Copious amounts of irrigation performed and found  to be hemostatic.  Peritoneum was closed with 2-0 Vicryl suture.  Fascia  closed with 0 Vicryl suture starting at the apices tying off at midline.  No fascial defects noted.  Skin edge approximated using 4-0 Vicryl suture  in subcuticular type fashion.  Steri-Strips were placed, sterile dressing  was placed.  Final sponge count, needle count and instrument count were  called for and found to be correct.  Clear yellow urine draining following  the procedure.  Patient was taken out of the operative suite and went to  recovery room in stable and satisfactory condition.  Will be admitted to  obstetrical service and followed closely.              "In order to promptly notify physicians concerning their patients, this  unconfirmed  document is being released.  It is not considered final until  reviewed and signed."        cc:    Alison Murray, D.O.

## 2013-07-15 NOTE — Progress Notes (Signed)
Labor Note  Date: 07/15/2013         Time: 2:21 AM    Subjective/Objective  Brittney [name] is a 28 y.o. female at 5243w5d  The patient was seen and examined.   Pt feels rectal pressure during contractions.     Blood pressure 136/82, pulse 81, temperature 98.1 ??F (36.7 ??C), temperature source Oral, resp. rate 18, height 5\' 4"  (1.626 m), weight 136 lb (61.689 kg), last menstrual period 07/31/201.    FHT: 135, moderate Variability  Accels: absent  Decels: variable and early  Contractions: every 2-3 minutes  Cervical Exam: 4-5 cm dilated, 50 effaced, +1 station  Pitocin @ 17 mu/min    Membranes Are: Ruptured clear fluid  Scalp Electrode in place: No  Intrauterine Pressure Catheter in Place: Yes    Assessment/Plan:  1. Brittney [name] is a 28 y.o. female [phone] 4843w5d   2. GBS Unknown   -Pen G for GBS prophylaxis  3. IOL secondary to IUGR and Low AFI for GA   -S/P Cervidil   -Pitocin per protocol   -Nubain for pain x2   -AROM with IUPC  4. Continue management of IOL    Dr. Olevia BowensMorelli in house.    Mack HookLandon Kvon Mcilhenny D.O.

## 2013-07-15 NOTE — Plan of Care (Signed)
Problem: Pain - Acute:  Goal: Pain level will decrease  Pain level will decrease   Outcome: Met This Shift  Pain well controlled with Toradol    Problem: Discharge Planning:  Goal: Discharged to appropriate level of care  Discharged to appropriate level of care   Outcome: Ongoing    Problem: Fluid Volume - Imbalance:  Goal: Absence of postpartum hemorrhage signs and symptoms  Absence of postpartum hemorrhage signs and symptoms   Outcome: Met This Shift  Small lochia rubra noted.    Problem: Infection - Surgical Site:  Goal: Will show no infection signs and symptoms  Will show no infection signs and symptoms   Outcome: Ongoing  Vital signs stable.  Incisional dressing still in place.

## 2013-07-15 NOTE — Progress Notes (Signed)
Dr Nedra Hailee paged and return paged and aware that pt to be taken back for c/s due to fetal distress.

## 2013-07-15 NOTE — Progress Notes (Signed)
Patient Name: Brittney [name]  Patient DOB: 1986-03-24  Room/Bed: 0707/0707-01  Admission Date/Time: 07/13/2013  8:29 PM    Date: 07/15/2013  Time: 3:45 AM        Brittney [name] is a 28 y.o. female  Obstetric History    G7   P6   T2   P4   A0   TAB0   SAB0   E0   M0   L5       # Outcome Date GA Lbr Len/2nd Weight Sex Delivery Anes PTL Lv   7 Current            6 Term 03/22/11 3381w0d  5 lb 5 oz (2.41 kg) F Vag-Spont  N Y   5 Preterm 03/30/10 3682w0d  7.9 oz (0.225 kg) M Vag-Spont  Y ND   4 Term 06/22/09 5381w0d  6 lb 8 oz (2.948 kg) F Vag-Spont   Y   3 Preterm 08/28/07 1046w0d  4 lb 1 oz (1.843 kg) F Vag-Spont  Y Y   2 Preterm 07/21/06 3146w0d  6 lb 5 oz (2.863 kg) M Jerral RalphVag-Spont  Y Y   1 Preterm 12/02/03 6480w0d  4 lb 5 oz (1.956 kg) M Vag-Spont   Y          Gestational Age:  2670w5d      The patient was seen and examined. The details of her pelvic exam can be found in the EPIC flow section of her EMR. Her pain  is well controlled by epidural but more comfortable.  The baby is moving well.    Tracing Review (Date of Tracing):  There Is moderate Variability  Filed Vitals:    07/15/13 0303 07/15/13 0305 07/15/13 0307 07/15/13 0335   BP: 131/83 133/75 134/73 137/64   Pulse: 90 84 87 92   Temp:       TempSrc:       Resp:   16 18   Height:       Weight:       SpO2:  99%  99%     FHT's are 120. Prolonged deceleration while pt flat on her back after epidural. Pitocin turned off. Pt turned on her side O2 via face mask given. FHTs returned to 115s to 120s with moderate variability.   The tracing is Category 2 .    Intervention:  O2 facemask/ pitocin off / pt turned onto side      Membranes Are: Ruptured clear fluid  Scalp Electrode in place: No  Intrauterine Pressure Catheter in Place: Yes          4 Week Lab Review:  Admission on 07/13/2013   Component Date Value Range Status   ??? WBC 07/13/2013 8.9  3.5 - 11.0 k/uL Final   ??? RBC 07/13/2013 3.61* 4.0 - 5.2 m/uL Final   ??? Hemoglobin 07/13/2013 11.1* 12.0 - 16.0 g/dL Final   ??? Hematocrit  07/13/2013 32.0* 36 - 46 % Final   ??? MCV 07/13/2013 88.8  80 - 100 fL Final   ??? MCH 07/13/2013 30.8  26 - 34 pg Final   ??? MCHC 07/13/2013 34.7  31 - 37 g/dL Final   ??? RDW 54/09/811904/03/2014 13.4  12.5 - 15.4 % Final   ??? Platelets 07/13/2013 238  140 - 450 k/uL Final   ??? MPV 07/13/2013 9.2  6.0 - 12.0 fL Final   ??? Differential Type 07/13/2013 NOT REPORTED   Final   ??? Seg Neutrophils 07/13/2013 61  36 - 66 % Final   ??? Lymphocytes 07/13/2013 33  24 - 44 % Final   ??? Monocytes 07/13/2013 5  2 - 11 % Final   ??? Eosinophils Relative Percent 07/13/2013 1  1 - 4 % Final   ??? Basophils 07/13/2013 0  0 - 2 % Final   ??? Segs Absolute 07/13/2013 5.30  1.8 - 7.7 k/uL Final   ??? Absolute Lymph # 07/13/2013 3.00  1.0 - 4.8 k/uL Final   ??? Absolute Mono # 07/13/2013 0.50  0.1 - 1.2 k/uL Final   ??? Absolute Eos # 07/13/2013 0.10  0.0 - 0.4 k/uL Final   ??? Basophils Absolute 07/13/2013 0.00  0.0 - 0.2 k/uL Final    [facility] [address], [place] [address] [phone]   ??? WBC Morphology 07/13/2013 NOT REPORTED   Final   ??? RBC Morphology 07/13/2013 NOT REPORTED   Final   ??? Platelet Estimate 07/13/2013 NOT REPORTED   Final   ??? Expiration Date 07/13/2013 07/16/2013   Final   ??? Arm Band Number 07/13/2013 [id number]   Final   ??? ABO/Rh 07/13/2013 O POSITIVE   Final   ??? Antibody Screen 07/13/2013 NEGATIVE   Final    Comment: Performed at [facility] [address],   [place] [address] [phone]     ??? T. Pallidum Ab 07/13/2013 NONREACTIVE  NR Final    Comment:       T. pallidum antibodies are not detected.  There is no serological evidence of infection with T. pallidum (early primary   syphilis cannot be excluded).  Retest in 2-4 weeks if syphilis is clinically   suspect.        [facility] [address], [place] [address] [phone]     ??? Color, UA 07/13/2013 YELLOW  YEL Final   ??? Turbidity UA 07/13/2013 CLOUDY* CLEAR Final   ??? Glucose, Ur 07/13/2013 1+* NEG Final   ??? Bilirubin Urine 07/13/2013 NEGATIVE   NEG Final   ??? Ketones, Urine 07/13/2013 SMALL* NEG Final   ??? Specific Gravity, UA 07/13/2013 1.017  1.005 - 1.030 Final   ??? Urine Hgb 07/13/2013 NEGATIVE  NEG Final   ??? pH, UA 07/13/2013 6.5  5.0 - 8.0 Final   ??? Protein, UA 07/13/2013 TRACE* NEG Final   ??? Urobilinogen, Urine 07/13/2013 Normal  NORM Final   ??? Nitrite, Urine 07/13/2013 NEGATIVE  NEG Final   ??? Leukocyte Esterase, Urine 07/13/2013 SMALL* NEG Final   ??? Urinalysis Comments 07/13/2013 Culture ordered based on defined criteria.   Corrected    [facility] [address] [place] [address] [phone]   ??? - 07/13/2013        Final   ??? WBC, UA 07/13/2013 10 TO 20  0 - 5 /HPF Final   ??? RBC, UA 07/13/2013 0 TO 2  0 - 2 /HPF Final   ??? Casts UA 07/13/2013 NOT REPORTED  0 - 2 /LPF Final   ??? Crystals UA 07/13/2013 NOT REPORTED  NONE /HPF Final   ??? Epithelial Cells UA 07/13/2013 10 TO 20  0 - 5 /HPF Final   ??? Renal Epithelial, Urine 07/13/2013 NOT REPORTED  0 /HPF Final   ??? Bacteria, UA 07/13/2013 FEW* NONE Final   ??? Mucus, UA 07/13/2013 1+* NONE Final    [facility] [address], [place] [address] [phone]   ??? Trichomonas, UA 07/13/2013 NOT REPORTED  NONE Final   ??? Amorphous,  UA 07/13/2013 NOT REPORTED  NONE Final   ??? Other Observations UA 07/13/2013 NOT REPORTED  NREQ Final   ??? Yeast, UA 07/13/2013 NOT REPORTED  NONE Final   ??? Specimen 07/13/2013 CLEAN CATCH URINE   Final   ??? Special Requests 07/13/2013 NOT REPORTED   Final   ??? Culture 07/13/2013 NO SIGNIFICANT GROWTH   Final   ??? Culture 07/13/2013 [facility] [address], [place] [address] [phone]   Final   ??? Status 07/13/2013 FINAL 07/14/2013   Final   Admission on 07/08/2013, Discharged on 07/09/2013   Component Date Value Range Status   ??? Specimen 07/08/2013 CLEAN CATCH URINE   Final   ??? Special Requests 07/08/2013 NOT REPORTED   Final   ??? Culture 07/08/2013 NO SIGNIFICANT GROWTH   Final   ??? Culture 07/08/2013 [facility] [address], [place] [address] [phone]    Final   ??? Status 07/08/2013 FINAL 07/09/2013   Final   ??? Color, UA 07/08/2013 YELLOW  YEL Final   ??? Turbidity UA 07/08/2013 CLOUDY* CLEAR Final   ??? Glucose, Ur 07/08/2013 1+* NEG Final   ??? Bilirubin Urine 07/08/2013 NEGATIVE  NEG Final   ??? Ketones, Urine 07/08/2013 NEGATIVE  NEG Final   ??? Specific Gravity, UA 07/08/2013 1.013  1.005 - 1.030 Final   ??? Urine Hgb 07/08/2013 NEGATIVE  NEG Final   ??? pH, UA 07/08/2013 7.0  5.0 - 8.0 Final   ??? Protein, UA 07/08/2013 NEGATIVE  NEG Final   ??? Urobilinogen, Urine 07/08/2013 Normal  NORM Final   ??? Nitrite, Urine 07/08/2013 NEGATIVE  NEG Final   ??? Leukocyte Esterase, Urine 07/08/2013 TRACE* NEG Final    [facility] [address], [place] [address] [phone]   ??? Urinalysis Comments 07/08/2013 NOT REPORTED   Final   ??? - 07/08/2013        Final   ??? WBC, UA 07/08/2013 2 TO 5  0 - 5 /HPF Final   ??? RBC, UA 07/08/2013 0 TO 2  0 - 2 /HPF Final   ??? Casts UA 07/08/2013 NOT REPORTED  0 - 2 /LPF Final   ??? Crystals UA 07/08/2013 NOT REPORTED  NONE /HPF Final   ??? Epithelial Cells UA 07/08/2013 5 TO 10  0 - 5 /HPF Final   ??? Renal Epithelial, Urine 07/08/2013 NOT REPORTED  0 /HPF Final   ??? Bacteria, UA 07/08/2013 FEW* NONE Final    [facility] [address] [place], [place] [address] [phone]   ??? Mucus, UA 07/08/2013 NOT REPORTED  NONE Final   ??? Trichomonas, UA 07/08/2013 NOT REPORTED  NONE Final   ??? Amorphous, UA 07/08/2013 NOT REPORTED  NONE Final   ??? Other Observations UA 07/08/2013 NOT REPORTED  NREQ Final   ??? Yeast, UA 07/08/2013 NOT REPORTED  NONE Final   Hospital Outpatient Visit on 06/19/2013   Component Date Value Range Status   ??? Hemoglobin A1C 06/19/2013 4.7  4.0 - 6.0 % Final   ??? Estimated Avg Glucose 06/19/2013 88   Final    Comment: The ADA and AACC recommend providing the estimated average glucose result to   permit better patient understanding of their HBA1c result.  Performed at [facility] [address], [place] [address] [phone]     ??? Hepatitis B  Surface Ag 06/19/2013 NONREACTIVE  NR Final    Performed at [facility] [address], [place] [address] [phone]   ??? Color, UA 06/19/2013 YELLOW  YEL Final   ??? Turbidity UA 06/19/2013 CLEAR  CLEAR Final   ??? Glucose, Ur 06/19/2013 3+* NEG Final   ??? Bilirubin Urine 06/19/2013 NEGATIVE  NEG Final   ??? Ketones, Urine 06/19/2013 NEGATIVE  NEG Final   ??? Specific Gravity, UA 06/19/2013 1.020  1.000 - 1.030 Final   ??? Urine Hgb 06/19/2013 NEGATIVE  NEG Final   ??? pH, UA 06/19/2013 7.5  5.0 - 8.0 Final   ??? Protein, UA 06/19/2013 NEGATIVE  NEG Final   ??? Urobilinogen, Urine 06/19/2013 Normal  NORM Final   ??? Nitrite, Urine 06/19/2013 NEGATIVE  NEG Final   ??? Leukocyte Esterase, Urine 06/19/2013 NEGATIVE  NEG Final   ??? Urinalysis Comments 06/19/2013 Microscopic exam not performed based on chemical results unless requested in   Final    Comment:  original order.  Performed at [facility] [address] [place], [place] [address]   [phone]     ??? TSH 06/19/2013 0.99  0.30 - 5.00 mIU/L Final    Performed at [facility] [address], [place] [address] [phone]   ??? WBC 06/19/2013 6.9  3.5 - 11.0 k/uL Final   ??? RBC 06/19/2013 3.62* 4.0 - 5.2 m/uL Final   ??? Hemoglobin 06/19/2013 11.4* 12.0 - 16.0 g/dL Final   ??? Hematocrit 06/19/2013 32.8* 36 - 46 % Final   ??? MCV 06/19/2013 90.7  80 - 100 fL Final   ??? MCH 06/19/2013 31.5  26 - 34 pg Final   ??? MCHC 06/19/2013 34.7  31 - 37 g/dL Final   ??? RDW 41/32/4401 13.5  12.5 - 15.4 % Final   ??? Platelets 06/19/2013 208  140 - 450 k/uL Final   ??? MPV 06/19/2013 10.3  6.0 - 12.0 fL Final   ??? Differential Type 06/19/2013 NOT REPORTED   Final   ??? Seg Neutrophils 06/19/2013 64  36 - 66 % Final   ??? Lymphocytes 06/19/2013 28  24 - 44 % Final   ??? Monocytes 06/19/2013 6  2 - 11 % Final   ??? Eosinophils Relative Percent 06/19/2013 1  1 - 4 % Final   ??? Basophils 06/19/2013 1  0 - 2 % Final   ??? Segs Absolute 06/19/2013 4.40  1.8 - 7.7 k/uL Final   ??? Absolute Lymph # 06/19/2013  1.90  1.0 - 4.8 k/uL Final   ??? Absolute Mono # 06/19/2013 0.40  0.1 - 1.2 k/uL Final   ??? Absolute Eos # 06/19/2013 0.10  0.0 - 0.4 k/uL Final   ??? Basophils Absolute 06/19/2013 0.10  0.0 - 0.2 k/uL Final    Performed at [facility] [address], [place] [address] [phone]   ??? WBC Morphology 06/19/2013 NOT REPORTED   Final   ??? RBC Morphology 06/19/2013 NOT REPORTED   Final   ??? Platelet Estimate 06/19/2013 NOT REPORTED   Final   ??? Glucose 06/19/2013 75  70 - 99 mg/dL Final    Performed at [facility] [address], [place] [address] [phone]   ??? Rubella Antibody, IGG 06/19/2013 100.1   Final    Comment:             REFERENCE RANGE:  <5.0       NON-REACTIVE (non-immune)  5.0 TO 9.9 EQUIVOCAL  >=10.0     REACTIVE     (immune)        NOTE: NEW REFERENCE RANGE  Performed at [facility] [address] [place] [address] [phone]     ???  ABO/Rh 06/19/2013 O POSITIVE   Final   ??? Antibody Screen 06/19/2013 NEGATIVE   Final    Comment: Performed at [facility] [address] [place], [place] [id number]   [phone]     ??? HIV 1/2 Antibody 06/19/2013 NONREACTIVE  NR Final    Comment:               Interpretation:         The presence of antibody to HIV and its association with the potential   infectivity, transmission or diagnosis of AIDS has not been established.  Furthermore, a 'Non-Reactive' test result does not exclude the possibility of   exposure to or infection with HIV.  If the above test result is 'Reactive', the Laboratory will order the   confirmatory test.  Performed at [facility] [address] [place], [place] [phone]     ??? T. Pallidum Ab 06/19/2013 NONREACTIVE  NR Final    Comment:       T. pallidum antibodies are not detected.  There is no serological evidence of infection with T. pallidum (early primary   syphilis cannot be excluded).  Retest in 2-4 weeks if syphilis is clinically   suspect.        Performed at [facility] [address] [place], [place]  [id number] [phone]     ??? GLU ADMN 06/19/2013 Glucola   Final    Comment: Performed at [facility] [address] [place], [place] [id number]   [phone]     ??? Glucose tolerance screen 50g 06/19/2013 86  70 - 135 mg/dL Final    Performed at [facility] [address] [place], [place] [id number] [phone]   ]    BP 137/64    Pulse 92    Temp(Src) 98.1 ??F (36.7 ??C) (Oral)    Resp 18    Ht 5\' 4"  (1.626 m)    Wt 136 lb (61.689 kg)    BMI 23.33 kg/m2      SpO2 99%    LMP 10/31/2012         Pelvic Exam:  Cervix Check:     DILATION:  5 cm   EFFACEMENT:   80%   STATION:  0 cm   CONSISTENCY:  soft   POSITION:  anterior    FETAL POSITION: Cephalic,    Assessment:  1. Brittney [name] is a 28 y.o. female [phone] 2102w5d     2.   OB History   Gravida Para Term Preterm AB SAB TAB Ectopic Multiple Living   7 6 2 4  0 0 0 0 0 5      # Outcome Date GA Lbr Len/2nd Weight Sex Delivery Anes PTL Lv   7 Current            6 Term 03/22/11 [redacted]w[redacted]d  5 lb 5 oz (2.41 kg) F Vag-Spont  N Y   5 Preterm 03/30/10 4546w0d  7.9 oz (0.225 kg) M Vag-Spont  Y ND      Comments: lived for 1.5 hrs   4 Term 06/22/09 792w0d  6 lb 8 oz (2.948 kg) F Vag-Spont   Y   3 Preterm 08/28/07 846w0d  4 lb 1 oz (1.843 kg) F Vag-Spont  Y Y   2 Preterm 07/21/06 196w0d  6 lb 5 oz (2.863 kg) M Jerral RalphVag-Spont  Y Y   1 Preterm 12/02/03 7735w0d  4 lb 5 oz (1.956 kg) M Vag-Spont   Y  Comments: Induced, PreEclampsia            3. INDUCTION      4.   Patient Active Problem List    Diagnosis Date Noted   ??? IUGR (11%) 07/13/2013     Priority: High   ??? Oligohydramnios (6.35) 07/10/2013     Priority: High   ??? Cervical shortening, antepartum condition or complication 03/19/2013 2.81 cm  03/05/2013     Priority: High     Referral for 17 P pt declined per MFM  CL every 1-2 weeks until 32 weeks  Follow up in 2 weeks 03/19/2013 per MFM     ??? Circumvallate placenta visualized on 03/05/2013 03/05/2013     Priority: High     Growth scans every 3-4 weeks starting at 24 weeks  32 week  antenatal testing     ??? Non-compliant pregnant patient 02/03/2013     Priority: High   ??? History of preterm labor, current pregnancy 01/23/2013     Priority: High     01/23/2013 CL every 1-2 weeks between -32 weeks  17 P to start 16-36 weeks  RTO in 5 weeks per MFM     ??? Grand multipara 01/06/2013     Priority: High   ??? Fetal demise, less than 22 weeks 01/06/2013     Priority: High   ??? Smokes 01/06/2013     Priority: High     Discussed smoking cessation     ??? History of gonorrhea      Priority: Medium   ??? History of trichomoniasis      Priority: Medium   ??? Varicosities      Priority: Low     LEGS, WORSE WHEN PREGNANT     ??? Depression      Priority: Low     BIPOLAR, SEVERE DEPRESSION ANXIETY, Ordered to go to counseling but refuses admission, reports ? suicidal  Attempts in pass, refuses medication.     ??? Cervical shortening, antepartum condition or complication 07/10/2013   ??? Circumvallate placenta 07/03/2013   ??? Poor fetal growth, affecting management of mother, antepartum condition or complication 07/03/2013   ??? Feeling pelvic pressure in pregnancy, antepartum 04/07/2013   ??? Circumvallate placenta in second trimester 03/19/2013   ??? Unspecified antepartum hemorrhage, antepartum 03/05/2013   ??? Subchorionic hematoma in first trimester, antepartum 01/23/2013   ??? Pregnancy with other poor obstetric history(V23.49) 01/23/2013                 Plan:   1. Continue with current care plan  2. C/w close observation. Will restart pitocin if FHR remains stable. D/W pt need for c-section if FHR decreases again. Procedure with risks/ complications d/w pt. Consent obtained        Electronically signed by Manson Passey, DO on 07/15/2013 at 3:45 AM

## 2013-07-15 NOTE — Anesthesia Post-Procedure Evaluation (Signed)
Anesthesia Post-op Note    Patient: Brittney [name]    Procedure(s) Performed: epidural    POST-OP ANESTHESIA NOTE       BP 136/82    Pulse 81    Temp(Src) 98.1 ??F (36.7 ??C) (Oral)    Resp 18    Ht 5\' 4"  (1.626 m)    Wt 136 lb (61.689 kg)    BMI 23.33 kg/m2      LMP 10/31/2012          Pain Adequately Treated: Following, treating        Nausea / Vomiting:  []   Present   [x]    Absent       Hydration:   [x]  Adequate   []   Not Adequate_________________________________       Cardiovascular Status:  Stable- [x]    YES   []   NO______________________________       Respiratory Function: Stable- [x]    YES   []   NO________________________________      Mental Status/LOC:  Stable      Complications:  None       Follow Up Care:  None      Other/Instructions:       Electronically signed by Nunzio CobbsSaebom Boy Delamater, MD on 07/15/2013 at 2:52 AM      Nunzio CobbsSaebom Ashdon Gillson, MD  2:52 AM

## 2013-07-16 LAB — CBC WITH AUTO DIFFERENTIAL
Absolute Eos #: 0.1 10*3/uL (ref 0.0–0.4)
Absolute Lymph #: 2.6 10*3/uL (ref 1.0–4.8)
Absolute Mono #: 0.8 10*3/uL (ref 0.1–1.2)
Basophils Absolute: 0 10*3/uL (ref 0.0–0.2)
Basophils: 0 % (ref 0–2)
Eosinophils %: 1 % (ref 1–4)
Hematocrit: 27.5 % — ABNORMAL LOW (ref 36–46)
Hemoglobin: 9.3 g/dL — ABNORMAL LOW (ref 12.0–16.0)
Lymphocytes: 25 % (ref 24–44)
MCH: 30.9 pg (ref 26–34)
MCHC: 33.7 g/dL (ref 31–37)
MCV: 91.6 fL (ref 80–100)
MPV: 8.9 fL (ref 6.0–12.0)
Monocytes: 8 % (ref 2–11)
Platelets: 216 10*3/uL (ref 140–450)
RBC: 3.01 m/uL — ABNORMAL LOW (ref 4.0–5.2)
RDW: 13.2 % (ref 12.5–15.4)
Seg Neutrophils: 66 % (ref 36–66)
Segs Absolute: 7 10*3/uL (ref 1.8–7.7)
WBC: 10.5 10*3/uL (ref 3.5–11.0)

## 2013-07-16 LAB — SURGICAL PATHOLOGY

## 2013-07-16 MED ADMIN — docusate sodium (COLACE) capsule 100 mg: 100 mg | ORAL | @ 13:00:00 | NDC 57896040110

## 2013-07-16 MED ADMIN — oxyCODONE-acetaminophen (PERCOCET) 5-325 MG per tablet 2 tablet: 2 | ORAL | @ 04:00:00 | NDC 00406051223

## 2013-07-16 MED ADMIN — oxyCODONE-acetaminophen (PERCOCET) 5-325 MG per tablet 2 tablet: 1 | ORAL | @ 13:00:00 | NDC 00406051223

## 2013-07-16 MED ADMIN — QUEtiapine (SEROQUEL XR) XR tablet 50 mg: 50 mg | ORAL | @ 02:00:00 | NDC 00310028060

## 2013-07-16 MED ADMIN — metroNIDAZOLE (FLAGYL) tablet 500 mg: 500 mg | ORAL | @ 13:00:00 | NDC 51079021717

## 2013-07-16 MED ADMIN — ferrous sulfate tablet 325 mg: 325 mg | ORAL | @ 21:00:00 | NDC 00603017932

## 2013-07-16 MED ADMIN — sodium chloride flush 0.9 % injection 10 mL: 10 mL | INTRAVENOUS | @ 13:00:00

## 2013-07-16 MED ADMIN — docusate sodium (COLACE) capsule 100 mg: 100 mg | ORAL | @ 02:00:00 | NDC 00904224461

## 2013-07-16 MED ADMIN — famotidine (PEPCID) injection 20 mg: 20 mg | INTRAVENOUS | @ 02:00:00 | NDC 00641602201

## 2013-07-16 MED ADMIN — lactated ringers infusion: INTRAVENOUS | @ 02:00:00 | NDC 00338011704

## 2013-07-16 MED ADMIN — oxyCODONE-acetaminophen (PERCOCET) 5-325 MG per tablet 2 tablet: 1 | ORAL | @ 18:00:00 | NDC 00406051223

## 2013-07-16 MED ADMIN — diphenhydrAMINE (BENADRYL) injection 25 mg: 25 mg | INTRAVENOUS | @ 02:00:00 | NDC 00641037621

## 2013-07-16 MED ADMIN — oxyCODONE-acetaminophen (PERCOCET) 5-325 MG per tablet 2 tablet: 1 | ORAL | @ 23:00:00 | NDC 00406051223

## 2013-07-16 MED FILL — IBUPROFEN 600 MG PO TABS: 600 MG | ORAL | Qty: 1

## 2013-07-16 MED FILL — DOK 100 MG PO CAPS: 100 MG | ORAL | Qty: 1

## 2013-07-16 MED FILL — DIPHENHYDRAMINE HCL 50 MG/ML IJ SOLN: 50 MG/ML | INTRAMUSCULAR | Qty: 1

## 2013-07-16 MED FILL — OXYCODONE-ACETAMINOPHEN 5-325 MG PO TABS: 5-325 MG | ORAL | Qty: 1

## 2013-07-16 MED FILL — MAGNESIUM HYDROXIDE 400 MG/5ML PO SUSP: 400 MG/5ML | ORAL | Qty: 30

## 2013-07-16 MED FILL — METRONIDAZOLE 500 MG PO TABS: 500 MG | ORAL | Qty: 1

## 2013-07-16 MED FILL — SEROQUEL XR 50 MG PO TB24: 50 MG | ORAL | Qty: 1

## 2013-07-16 MED FILL — OXYCODONE-ACETAMINOPHEN 5-325 MG PO TABS: 5-325 MG | ORAL | Qty: 2

## 2013-07-16 MED FILL — FERROUS SULFATE 325 (65 FE) MG PO TABS: 325 (65 Fe) MG | ORAL | Qty: 1

## 2013-07-16 NOTE — Plan of Care (Signed)
Problem: Pain - Acute:  Goal: Pain level will decrease  Pain level will decrease   Outcome: Met This Shift  Goal: Able to cope with pain  Able to cope with pain   Outcome: Met This Shift    Problem: Discharge Planning:  Goal: Discharged to appropriate level of care  Discharged to appropriate level of care   Outcome: Ongoing    Problem: Fluid Volume - Imbalance:  Goal: Absence of postpartum hemorrhage signs and symptoms  Absence of postpartum hemorrhage signs and symptoms   Outcome: Met This Shift    Problem: Infection - Surgical Site:  Goal: Will show no infection signs and symptoms  Will show no infection signs and symptoms   Outcome: Met This Shift

## 2013-07-16 NOTE — Progress Notes (Signed)
Obstetrical Rounds:    Date: 07/16/2013  Time: 10:03 AM      Patient Name: Brittney [name]  Patient DOB: [birth year]  Room/Bed: 0748/0748-01  Admission Date/Time: 07/13/2013  8:29 PM        Attending Physician Statement  I have discussed the care of Domingo Pulse, including pertinent history and exam findings,  with the resident. I have reviewed their note in the electronic medical record. I have seen and examined the patient and the key elements of all parts of the encounter have been performed/reviewed by me .  I agree with the assessment, plan and orders as documented by the resident. Pt denies lightheadedness/ dizziness. Will recheck H/H.    Filed Vitals:    07/16/13 0830   BP: 111/57   Pulse: 90   Temp: 99 ??F (37.2 ??C)   Resp: 18       Admission on 07/13/2013   Component Date Value Range Status   ??? WBC 07/13/2013 8.9  3.5 - 11.0 k/uL Final   ??? RBC 07/13/2013 3.61* 4.0 - 5.2 m/uL Final   ??? Hemoglobin 07/13/2013 11.1* 12.0 - 16.0 g/dL Final   ??? Hematocrit 07/13/2013 32.0* 36 - 46 % Final   ??? MCV 07/13/2013 88.8  80 - 100 fL Final   ??? MCH 07/13/2013 30.8  26 - 34 pg Final   ??? MCHC 07/13/2013 34.7  31 - 37 g/dL Final   ??? RDW 16/01/9603 13.4  12.5 - 15.4 % Final   ??? Platelets 07/13/2013 238  140 - 450 k/uL Final   ??? MPV 07/13/2013 9.2  6.0 - 12.0 fL Final   ??? Differential Type 07/13/2013 NOT REPORTED   Final   ??? Seg Neutrophils 07/13/2013 61  36 - 66 % Final   ??? Lymphocytes 07/13/2013 33  24 - 44 % Final   ??? Monocytes 07/13/2013 5  2 - 11 % Final   ??? Eosinophils Relative Percent 07/13/2013 1  1 - 4 % Final   ??? Basophils 07/13/2013 0  0 - 2 % Final   ??? Segs Absolute 07/13/2013 5.30  1.8 - 7.7 k/uL Final   ??? Absolute Lymph # 07/13/2013 3.00  1.0 - 4.8 k/uL Final   ??? Absolute Mono # 07/13/2013 0.50  0.1 - 1.2 k/uL Final   ??? Absolute Eos # 07/13/2013 0.10  0.0 - 0.4 k/uL Final   ??? Basophils Absolute 07/13/2013 0.00  0.0 - 0.2 k/uL Final    [facility] [address], [place] [address] [phone]   ??? WBC  Morphology 07/13/2013 NOT REPORTED   Final   ??? RBC Morphology 07/13/2013 NOT REPORTED   Final   ??? Platelet Estimate 07/13/2013 NOT REPORTED   Final   ??? Expiration Date 07/13/2013 07/16/2013   Final   ??? Arm Band Number 07/13/2013 [id number]   Final   ??? ABO/Rh 07/13/2013 O POSITIVE   Final   ??? Antibody Screen 07/13/2013 NEGATIVE   Final    Comment: Performed at [facility] [address],   [place] [address] [phone]     ??? T. Pallidum Ab 07/13/2013 NONREACTIVE  NR Final    Comment:       T. pallidum antibodies are not detected.  There is no serological evidence of infection with T. pallidum (early primary   syphilis cannot be excluded).  Retest in 2-4 weeks if syphilis is clinically   suspect.        [facility]  [address] [place], [place] [id number] [phone]     ??? Color, UA 07/13/2013 YELLOW  YEL Final   ??? Turbidity UA 07/13/2013 CLOUDY* CLEAR Final   ??? Glucose, Ur 07/13/2013 1+* NEG Final   ??? Bilirubin Urine 07/13/2013 NEGATIVE  NEG Final   ??? Ketones, Urine 07/13/2013 SMALL* NEG Final   ??? Specific Gravity, UA 07/13/2013 1.017  1.005 - 1.030 Final   ??? Urine Hgb 07/13/2013 NEGATIVE  NEG Final   ??? pH, UA 07/13/2013 6.5  5.0 - 8.0 Final   ??? Protein, UA 07/13/2013 TRACE* NEG Final   ??? Urobilinogen, Urine 07/13/2013 Normal  NORM Final   ??? Nitrite, Urine 07/13/2013 NEGATIVE  NEG Final   ??? Leukocyte Esterase, Urine 07/13/2013 SMALL* NEG Final   ??? Urinalysis Comments 07/13/2013 Culture ordered based on defined criteria.   Corrected    [facility] [address] [place], [place] [id number] [phone]   ??? - 07/13/2013        Final   ??? WBC, UA 07/13/2013 10 TO 20  0 - 5 /HPF Final   ??? RBC, UA 07/13/2013 0 TO 2  0 - 2 /HPF Final   ??? Casts UA 07/13/2013 NOT REPORTED  0 - 2 /LPF Final   ??? Crystals UA 07/13/2013 NOT REPORTED  NONE /HPF Final   ??? Epithelial Cells UA 07/13/2013 10 TO 20  0 - 5 /HPF Final   ??? Renal Epithelial, Urine 07/13/2013 NOT REPORTED  0 /HPF Final   ??? Bacteria, UA  07/13/2013 FEW* NONE Final   ??? Mucus, UA 07/13/2013 1+* NONE Final    [facility] [address] [place], [place] [id number] [phone]   ??? Trichomonas, UA 07/13/2013 NOT REPORTED  NONE Final   ??? Amorphous, UA 07/13/2013 NOT REPORTED  NONE Final   ??? Other Observations UA 07/13/2013 NOT REPORTED  NREQ Final   ??? Yeast, UA 07/13/2013 NOT REPORTED  NONE Final   ??? Specimen 07/13/2013 VAGINA   Final   ??? Special Requests 07/13/2013 NOT REPORTED   Final   ??? Culture 07/13/2013 CULTURE IN PROGRESS   Final   ??? Culture 07/13/2013 [facility] [address] [place], [place] [id number] [phone]   Final   ??? Status 07/13/2013 Pending   Incomplete   ??? Specimen 07/13/2013 CLEAN CATCH URINE   Final   ??? Special Requests 07/13/2013 NOT REPORTED   Final   ??? Culture 07/13/2013 NO SIGNIFICANT GROWTH   Final   ??? Culture 07/13/2013 [facility] [address] [place], [place] [id number] [phone]   Final   ??? Status 07/13/2013 FINAL 07/14/2013   Final   ??? pH, Cord Art 07/15/2013 7.264* 7.30 - 7.40 Final   ??? pCO2, Cord Art 07/15/2013 56.9* 40 - 50 mmHg Final   ??? pO2, Cord Art 07/15/2013 18.5  15 - 25 mmHg Final   ??? HCO3, Cord Art 07/15/2013 24.9* 29 - 39 mmol/L Final   ??? Positive Base Excess, Cord, Art 07/15/2013 NOT REPORTED  0.0 - 2.0 mmol/L Final   ??? Negative Base Excess, Cord, Art 07/15/2013 3* 0.0 - 2.0 mmol/L Final   ??? O2 Sat, Cord Art 07/15/2013 NOT REPORTED   Final   ??? Carboxyhemoglobin 07/15/2013 NOT REPORTED   Final   ??? Methemoglobin 07/15/2013 NOT REPORTED  0.0 - 1.9 % Final   ??? Text for Respiratory 07/15/2013 NOT REPORTED   Final   ??? pH, Cord Ven 07/15/2013 7.324* 7.35 - 7.45 Final   ??? pCO2, Cord Ven 07/15/2013 44.2*  28.0 - 40.0 mmHg Final   ??? pO2, Cord Ven 07/15/2013 26.6  21.0 - 31.0 mmHg Final   ??? HCO3, Cord Ven 07/15/2013 22.3  20 - 32 mmol/L Final   ??? Positive Base Excess, Cord, Ven 07/15/2013 NOT REPORTED  0.0 - 2.0 mmol/L Final   ??? Negative Base Excess, Cord, Ven 07/15/2013 3* 0.0 - 2.0 mmol/L Final    [facility]  [address] [place], [place] [id number] [phone]   ??? O2 Sat, Cord Ven 07/15/2013 NOT REPORTED   Final   ??? Carboxyhemoglobin 07/15/2013 NOT REPORTED   Final   ??? Methemoglobin 07/15/2013 NOT REPORTED  0.0 - 1.9 % Final   ??? WBC 07/16/2013 10.5  3.5 - 11.0 k/uL Final   ??? RBC 07/16/2013 3.01* 4.0 - 5.2 m/uL Final   ??? Hemoglobin 07/16/2013 9.3* 12.0 - 16.0 g/dL Final   ??? Hematocrit 07/16/2013 27.5* 36 - 46 % Final   ??? MCV 07/16/2013 91.6  80 - 100 fL Final   ??? MCH 07/16/2013 30.9  26 - 34 pg Final   ??? MCHC 07/16/2013 33.7  31 - 37 g/dL Final   ??? RDW 91/47/829504/15/2015 13.2  12.5 - 15.4 % Final   ??? Platelets 07/16/2013 216  140 - 450 k/uL Final   ??? MPV 07/16/2013 8.9  6.0 - 12.0 fL Final   ??? Differential Type 07/16/2013 NOT REPORTED   Final   ??? Seg Neutrophils 07/16/2013 66  36 - 66 % Final   ??? Lymphocytes 07/16/2013 25  24 - 44 % Final   ??? Monocytes 07/16/2013 8  2 - 11 % Final   ??? Eosinophils Relative Percent 07/16/2013 1  1 - 4 % Final   ??? Basophils 07/16/2013 0  0 - 2 % Final   ??? Segs Absolute 07/16/2013 7.00  1.8 - 7.7 k/uL Final   ??? Absolute Lymph # 07/16/2013 2.60  1.0 - 4.8 k/uL Final   ??? Absolute Mono # 07/16/2013 0.80  0.1 - 1.2 k/uL Final   ??? Absolute Eos # 07/16/2013 0.10  0.0 - 0.4 k/uL Final   ??? Basophils Absolute 07/16/2013 0.00  0.0 - 0.2 k/uL Final    [facility] [address] [place], [place] [address] [phone]   ??? WBC Morphology 07/16/2013 NOT REPORTED   Final   ??? RBC Morphology 07/16/2013 NOT REPORTED   Final   ??? Platelet Estimate 07/16/2013 NOT REPORTED   Final   ]    Home care, Restrictions,  Follow up Care, and birth control review completed    RTO 2 weeks    Secondary Smoke risks and Sudden Infant Death Syndrome were reviewed with recommendations.    Signs and Symptoms of Post Partum Depression were reviewed. The patient is to call if any occur.    Signs and symptoms of Mastitis were reviewed. The patient is to call if any occur for follow up.      Attending's Name:  Electronically signed by Manson PasseyMarie M  Damontae Loppnow, DO on 07/16/2013 at 10:03 AM

## 2013-07-16 NOTE — Progress Notes (Signed)
POST OPERATIVE DAY # 1    Brittney [name] is a 28 y.o. female   This patient was seen and examined today.     Her pregnancy was complicated by:   Patient Active Problem List   Diagnosis   ??? History of trichomoniasis   ??? Varicosities   ??? Depression   ??? History of gonorrhea   ??? Grand multipara   ??? Fetal demise, less than 22 weeks   ??? Smokes   ??? Subchorionic hematoma in first trimester, antepartum   ??? History of preterm labor, current pregnancy   ??? Pregnancy with other poor obstetric history(V23.49)   ??? Non-compliant pregnant patient   ??? Cervical shortening, antepartum condition or complication 03/19/2013 2.81 cm    ??? Circumvallate placenta visualized on 03/05/2013   ??? Unspecified antepartum hemorrhage, antepartum   ??? Circumvallate placenta in second trimester   ??? Feeling pelvic pressure in pregnancy, antepartum   ??? Circumvallate placenta   ??? Poor fetal growth, affecting management of mother, antepartum condition or complication   ??? Cervical shortening, antepartum condition or complication   ??? Oligohydramnios (6.35)   ??? IUGR (11%)   ??? PLTCS 07/15/13 F Apg 8,9 Wt. 5#7       Today she is doing well without any chief complaint. Her lochia is light. She denies chest pain, shortness of breath, headache, lightheadedness, peripheral edema and palpitations. She is not breast feeding and she denies any signs or symptoms of mastitis.  She is ambulating well. She had her foley catheter removed about 0230 and she has not had a spontaneous void yet - will watch carefully. She currently denies S/S of postpartum depression. Flatus absent.  Bowel movement absent. She is tolerating solids.    Vital Signs:  Filed Vitals:    07/15/13 1230 07/15/13 1534 07/15/13 1900 07/15/13 2335   BP: 111/67 107/64 108/64 103/56   Pulse: 85 80 86 87   Temp: 97.9 ??F (36.6 ??C) 97.7 ??F (36.5 ??C) 97.7 ??F (36.5 ??C) 97.7 ??F (36.5 ??C)   TempSrc: Oral Oral Oral Oral   Resp: 14 16 16 16    Height:       Weight:       SpO2: 100%            Urine Input & Output  last 24hrs:     Intake/Output Summary (Last 24 hours) at 07/16/13 0351  Last data filed at 07/16/13 0200   Gross per 24 hour   Intake   4749 ml   Output   4500 ml   Net    249 ml       Physical Exam:  General:  no apparent distress, alert and cooperative  Neurologic:  CN II-XII GI  Lungs:  CTA BL  Heart:  regular rate and rhythm and no murmur    Abdomen: abdomen soft, non-distended, non-tender  Fundus: non-tender, normal size, firm, below umbilicus  Incision: Bandage in place, no drainage - pt. Refusing removal at this time as she is trying to sleep.    Extremities:  no calf tenderness, non edematous, neg homans    Labs:  Lab Results   Component Value Date    WBC 8.9 07/13/2013    HGB 11.1* 07/13/2013    HCT 32.0* 07/13/2013    MCV 88.8 07/13/2013    PLT 238 07/13/2013       Assessment/Plan:  1. Brittney [name] is POD # 1 s/p PLTCS   - Doing well, VSS    -  female infant in NICU - respiratory   - Encourage ambulation and use of incentive spirometer   - Foley D/C'd and IV hep locked, will await spontaneous void.  If no spontaneous void in 6 hours will straight cath.  RN aware    - CBC awaiting  2. Rh positive/Rubella immune  3. Bottle feeding   4. Continue post-op care.  - Needs to have bandage removed this AM.  RN aware.    Counseling Completed:  Secondary Smoke risks and Sudden Infant Death Syndrome were reviewed with recommendations. Infant sleeping, "back to sleep" and avoidance of co-sleeping recommendations were reviewed.  Signs and Symptoms of Post Partum Depression were reviewed. The patient is to call if any occur.  Signs and symptoms of Mastitis were reviewed. The patient is to call if any occur for follow up.  Discharge instructions including pelvic rest, incision care, 15 lb weight restriction, no driving with pain medicine and office follow-up were reviewed with patient     Providers Name: Dr. Essie ChristineMorelli    Beau Ramsburg, DO  Ob/Gyn Resident  07/16/2013, 3:51 AM

## 2013-07-17 LAB — CULTURE, STREP B SCREEN, VAGINAL/RECTAL: Culture: NEGATIVE

## 2013-07-17 LAB — HEMOGLOBIN AND HEMATOCRIT
Hematocrit: 26.9 % — ABNORMAL LOW (ref 36–46)
Hemoglobin: 9.1 g/dL — ABNORMAL LOW (ref 12.0–16.0)

## 2013-07-17 MED ADMIN — oxyCODONE-acetaminophen (PERCOCET) 5-325 MG per tablet 2 tablet: 2 | ORAL | @ 11:00:00 | NDC 00406051223

## 2013-07-17 MED ADMIN — ferrous sulfate tablet 325 mg: 325 mg | ORAL | @ 13:00:00 | NDC 00603017932

## 2013-07-17 MED ADMIN — magnesium hydroxide (MILK OF MAGNESIA) 400 MG/5ML suspension 30 mL: 30 mL | ORAL | @ 19:00:00 | NDC 00121043130

## 2013-07-17 MED ADMIN — ferrous sulfate tablet 325 mg: 325 mg | ORAL | @ 22:00:00 | NDC 00603017932

## 2013-07-17 MED ADMIN — oxyCODONE-acetaminophen (PERCOCET) 5-325 MG per tablet 2 tablet: 2 | ORAL | @ 03:00:00 | NDC 00406051223

## 2013-07-17 MED ADMIN — simethicone (MYLICON) chewable tablet 80 mg: 80 mg | ORAL | @ 13:00:00 | NDC 63739022510

## 2013-07-17 MED ADMIN — oxyCODONE-acetaminophen (PERCOCET) 5-325 MG per tablet 2 tablet: 1 | ORAL | @ 23:00:00 | NDC 00406051223

## 2013-07-17 MED ADMIN — docusate sodium (COLACE) capsule 100 mg: 100 mg | ORAL | @ 02:00:00 | NDC 57896040110

## 2013-07-17 MED ADMIN — docusate sodium (COLACE) capsule 100 mg: 100 mg | ORAL | @ 12:00:00 | NDC 57896040110

## 2013-07-17 MED ADMIN — metroNIDAZOLE (FLAGYL) tablet 500 mg: 500 mg | ORAL | @ 03:00:00 | NDC 51079021717

## 2013-07-17 MED ADMIN — simethicone (MYLICON) chewable tablet 80 mg: 80 mg | ORAL | @ 02:00:00 | NDC 63739022510

## 2013-07-17 MED ADMIN — oxyCODONE-acetaminophen (PERCOCET) 5-325 MG per tablet 2 tablet: 1 | ORAL | @ 16:00:00 | NDC 00406051223

## 2013-07-17 MED ADMIN — QUEtiapine (SEROQUEL XR) XR tablet 50 mg: 50 mg | ORAL | @ 02:00:00 | NDC 00310028060

## 2013-07-17 MED ADMIN — simethicone (MYLICON) chewable tablet 80 mg: 80 mg | ORAL | @ 19:00:00 | NDC 63739022510

## 2013-07-17 MED ADMIN — metroNIDAZOLE (FLAGYL) tablet 500 mg: 500 mg | ORAL | @ 13:00:00 | NDC 51079021717

## 2013-07-17 MED FILL — SIMETHICONE 80 MG PO CHEW: 80 MG | ORAL | Qty: 1

## 2013-07-17 MED FILL — IBUPROFEN 600 MG PO TABS: 600 MG | ORAL | Qty: 1

## 2013-07-17 MED FILL — DOK 100 MG PO CAPS: 100 MG | ORAL | Qty: 1

## 2013-07-17 MED FILL — SEROQUEL XR 50 MG PO TB24: 50 MG | ORAL | Qty: 1

## 2013-07-17 MED FILL — OXYCODONE-ACETAMINOPHEN 5-325 MG PO TABS: 5-325 MG | ORAL | Qty: 2

## 2013-07-17 MED FILL — METRONIDAZOLE 500 MG PO TABS: 500 MG | ORAL | Qty: 1

## 2013-07-17 MED FILL — FERROUS SULFATE 325 (65 FE) MG PO TABS: 325 (65 Fe) MG | ORAL | Qty: 1

## 2013-07-17 MED FILL — MAGNESIUM HYDROXIDE 400 MG/5ML PO SUSP: 400 MG/5ML | ORAL | Qty: 30

## 2013-07-17 MED FILL — MORPHINE SULFATE (PF) 1 MG/ML IJ SOLN: 1 MG/ML | INTRAMUSCULAR | Qty: 10

## 2013-07-17 MED FILL — OXYCODONE-ACETAMINOPHEN 5-325 MG PO TABS: 5-325 MG | ORAL | Qty: 1

## 2013-07-17 NOTE — Discharge Instructions (Signed)
Discharge Instructions for Cesarean Birth     During a cesarean section (C-section), an incision is made in the abdomen and uterus (womb) to deliver the baby. The normal hospital stay is 2-4 days.   Steps to Take   Home Care     For the first 1-2 weeks, ask for someone to help you at home.     Let people help you. Take frequent rest breaks.     For vaginal bleeding, use extra absorbent pads.     Keep the incision area clean and dry.     Ask your doctor about when it is safe to shower, bathe, or soak in water.     Avoid heavy lifting for six weeks.    Diet    After a cesarean birth, you will start with a clear liquid diet. Examples include: Jell-o, broth, and ginger ale. If you tolerate that, you can slowly go back to your regular diet. Stay away from anything greasy or spicy right after surgery. These types of foods can upset your stomach. Drink lots of fluids to prevent constipation.   Physical Activity     Do not lift anything heavier than your baby.     When shifting positions, use a pillow to support the area where the incisions were made.     Get up slowly. This will help you to avoid feeling dizzy or light headed.     Try to move around each day. Light physical activity will help with your recovery.     Ask your doctor when you will be able to go back to work.     Do not drive unless your doctor has given you permission to do so. Do not drive if you are taking prescription pain medicine.     Ask your doctor when you will be able to resume sexual activity. If you have not done so already, talk to your doctor about birth control options.    Medications    Your doctor may recommend pain medicine to ease discomfort.   If you are taking medicines, follow these general guidelines:    Take your medicine as directed. Do not change the amount or the schedule.    Do not stop taking them without talking to your doctor.    Do not share them.    Know what the results and side effects. Report them to your  doctor.    Some drugs can be dangerous when mixed. Talk to a doctor or pharmacist if you are taking more than one drug. This includes over-the-counter medicine and herb or dietary supplements.    Plan ahead for refills so you don't run out.   Lifestyle Changes    You and your doctor will plan lifestyle changes that will help you recover. To get encouragement and learn strategies, consider joining a support group for new mothers.   Follow-up   Make a follow-up appointment as directed by your doctor.   Call Your Doctor If Any of the Following Occurs   After you leave the hospital, call your doctor if any of the following occurs:    Signs of infection, including fever and chills    Heavy vaginal bleeding    Foul-smelling vaginal discharge    Excessive bleeding, redness, swelling, increasing pain or discharge from the incision site    Nausea and/or vomiting that you cannot control with the medicines you were given after surgery, or which persist for more than two days after discharge from the   hospital    Pain that you cannot control with the medicines you have been given    Swelling and/or pain in one or both legs    Cough, shortness of breath, or chest pain    Joint pain, fatigue, stiffness, rash, or other new symptoms    Become dizzy or faint   If you think you have an emergency,  CALL 911  .

## 2013-07-17 NOTE — Progress Notes (Signed)
Obstetrical Rounds:    Date: 07/17/2013  Time: 8:50 AM      Patient Name: Brittney [name]  Patient DOB: June 04, 1985  Room/Bed: 0748/0748-01  Admission Date/Time: 07/13/2013  8:29 PM        Attending Physician Statement  I have discussed the care of Brittney [name], including pertinent history and exam findings,  with the resident. I have reviewed their note in the electronic medical record. I have seen and examined the patient and the key elements of all parts of the encounter have been performed/reviewed by me .  I agree with the assessment, plan and orders as documented by the resident.     Filed Vitals:    07/16/13 2000   BP: 125/85   [name]: 90   Temp: 97.9 ??F (36.6 ??C)   Resp: 18       Admission on 07/13/2013   Component Date Value Range Status   ??? WBC 07/13/2013 8.9  3.5 - 11.0 k/uL Final   ??? RBC 07/13/2013 3.61* 4.0 - 5.2 m/uL Final   ??? Hemoglobin 07/13/2013 11.1* 12.0 - 16.0 g/dL Final   ??? Hematocrit 07/13/2013 32.0* 36 - 46 % Final   ??? MCV 07/13/2013 88.8  80 - 100 fL Final   ??? MCH 07/13/2013 30.8  26 - 34 pg Final   ??? MCHC 07/13/2013 34.7  31 - 37 g/dL Final   ??? RDW 16/01/9603 13.4  12.5 - 15.4 % Final   ??? Platelets 07/13/2013 238  140 - 450 k/uL Final   ??? MPV 07/13/2013 9.2  6.0 - 12.0 fL Final   ??? Differential Type 07/13/2013 NOT REPORTED   Final   ??? Seg Neutrophils 07/13/2013 61  36 - 66 % Final   ??? Lymphocytes 07/13/2013 33  24 - 44 % Final   ??? Monocytes 07/13/2013 5  2 - 11 % Final   ??? Eosinophils Relative Percent 07/13/2013 1  1 - 4 % Final   ??? Basophils 07/13/2013 0  0 - 2 % Final   ??? Segs Absolute 07/13/2013 5.30  1.8 - 7.7 k/uL Final   ??? Absolute Lymph # 07/13/2013 3.00  1.0 - 4.8 k/uL Final   ??? Absolute Mono # 07/13/2013 0.50  0.1 - 1.2 k/uL Final   ??? Absolute Eos # 07/13/2013 0.10  0.0 - 0.4 k/uL Final   ??? Basophils Absolute 07/13/2013 0.00  0.0 - 0.2 k/uL Final    [facility] [address], [place] [address] [phone]   ??? WBC Morphology 07/13/2013 NOT REPORTED   Final   ??? RBC  Morphology 07/13/2013 NOT REPORTED   Final   ??? Platelet Estimate 07/13/2013 NOT REPORTED   Final   ??? Expiration Date 07/13/2013 07/16/2013   Final   ??? Arm Band Number 07/13/2013 [id number]   Final   ??? ABO/Rh 07/13/2013 O POSITIVE   Final   ??? Antibody Screen 07/13/2013 NEGATIVE   Final    Comment: Performed at [facility] [address],   [place] [address] [phone]     ??? T. Pallidum Ab 07/13/2013 NONREACTIVE  NR Final    Comment:       T. pallidum antibodies are not detected.  There is no serological evidence of infection with T. pallidum (early primary   syphilis cannot be excluded).  Retest in 2-4 weeks if syphilis is clinically   suspect.        [facility] [name], [place] [address] (  [phone]     ??? Color, UA 07/13/2013 YELLOW  YEL Final   ??? Turbidity UA 07/13/2013 CLOUDY* CLEAR Final   ??? Glucose, Ur 07/13/2013 1+* NEG Final   ??? Bilirubin Urine 07/13/2013 NEGATIVE  NEG Final   ??? Ketones, Urine 07/13/2013 SMALL* NEG Final   ??? Specific Gravity, UA 07/13/2013 1.017  1.005 - 1.030 Final   ??? Urine Hgb 07/13/2013 NEGATIVE  NEG Final   ??? pH, UA 07/13/2013 6.5  5.0 - 8.0 Final   ??? Protein, UA 07/13/2013 TRACE* NEG Final   ??? Urobilinogen, Urine 07/13/2013 Normal  NORM Final   ??? Nitrite, Urine 07/13/2013 NEGATIVE  NEG Final   ??? Leukocyte Esterase, Urine 07/13/2013 SMALL* NEG Final   ??? Urinalysis Comments 07/13/2013 Culture ordered based on defined criteria.   Corrected    [facility] [address] [place], [place] [id number] [phone]   ??? - 07/13/2013        Final   ??? WBC, UA 07/13/2013 10 TO 20  0 - 5 /HPF Final   ??? RBC, UA 07/13/2013 0 TO 2  0 - 2 /HPF Final   ??? Casts UA 07/13/2013 NOT REPORTED  0 - 2 /LPF Final   ??? Crystals UA 07/13/2013 NOT REPORTED  NONE /HPF Final   ??? Epithelial Cells UA 07/13/2013 10 TO 20  0 - 5 /HPF Final   ??? Renal Epithelial, Urine 07/13/2013 NOT REPORTED  0 /HPF Final   ??? Bacteria, UA 07/13/2013 FEW* NONE Final   ??? Mucus, UA 07/13/2013 1+* NONE  Final    [facility] [address] [place], [place] [id number] [phone]   ??? Trichomonas, UA 07/13/2013 NOT REPORTED  NONE Final   ??? Amorphous, UA 07/13/2013 NOT REPORTED  NONE Final   ??? Other Observations UA 07/13/2013 NOT REPORTED  NREQ Final   ??? Yeast, UA 07/13/2013 NOT REPORTED  NONE Final   ??? Specimen 07/13/2013 VAGINA   Final   ??? Special Requests 07/13/2013 NOT REPORTED   Final   ??? Culture 07/13/2013 NEGATIVE FOR GROUP B STREPTOCOCCI   Final   ??? Culture 07/13/2013 [facility] [address] [place], [place] [id number] [phone]   Final   ??? Status 07/13/2013 FINAL 07/17/2013   Final   ??? Specimen 07/13/2013 CLEAN CATCH URINE   Final   ??? Special Requests 07/13/2013 NOT REPORTED   Final   ??? Culture 07/13/2013 NO SIGNIFICANT GROWTH   Final   ??? Culture 07/13/2013 [facility] [address] [place], [place] [id number] [phone]   Final   ??? Status 07/13/2013 FINAL 07/14/2013   Final   ??? pH, Cord Art 07/15/2013 7.264* 7.30 - 7.40 Final   ??? pCO2, Cord Art 07/15/2013 56.9* 40 - 50 mmHg Final   ??? pO2, Cord Art 07/15/2013 18.5  15 - 25 mmHg Final   ??? HCO3, Cord Art 07/15/2013 24.9* 29 - 39 mmol/L Final   ??? Positive Base Excess, Cord, Art 07/15/2013 NOT REPORTED  0.0 - 2.0 mmol/L Final   ??? Negative Base Excess, Cord, Art 07/15/2013 3* 0.0 - 2.0 mmol/L Final   ??? O2 Sat, Cord Art 07/15/2013 NOT REPORTED   Final   ??? Carboxyhemoglobin 07/15/2013 NOT REPORTED   Final   ??? Methemoglobin 07/15/2013 NOT REPORTED  0.0 - 1.9 % Final   ??? Text for Respiratory 07/15/2013 NOT REPORTED   Final   ??? pH, Cord Ven 07/15/2013 7.324* 7.35 - 7.45 Final   ??? pCO2, Cord Ven 07/15/2013 44.2* 28.0 - 40.0  mmHg Final   ??? pO2, Cord Ven 07/15/2013 26.6  21.0 - 31.0 mmHg Final   ??? HCO3, Cord Ven 07/15/2013 22.3  20 - 32 mmol/L Final   ??? Positive Base Excess, Cord, Ven 07/15/2013 NOT REPORTED  0.0 - 2.0 mmol/L Final   ??? Negative Base Excess, Cord, Ven 07/15/2013 3* 0.0 - 2.0 mmol/L Final    [facility] [address] [place], [place] [id number]  [phone]   ??? O2 Sat, Cord Ven 07/15/2013 NOT REPORTED   Final   ??? Carboxyhemoglobin 07/15/2013 NOT REPORTED   Final   ??? Methemoglobin 07/15/2013 NOT REPORTED  0.0 - 1.9 % Final   ??? Surgical Pathology Report 07/15/2013    Final                    Value:(NOTE)  [id number]  [facility]  ANATOMIC PATHOLOGY  [address]  [place], [place] [id number]  [phone]  Fax: [phone]    SURGICAL PATHOLOGY CONSULTATION    Patient Name: Brittney PulseHAYMAN, Intisar L.  Med Rec: [id number]  Path Number: [id number]  Collected: 07/15/2013  Received: 07/15/2013  Reported: 07/16/2013 12:02    -- Diagnosis --  PLACENTA, CORD AND MEMBRANES, DELIVERY:      -  PARTIAL CIRCUMMARGINATE INSERTION OF MEMBRANES (50%).      -  MILD SUBCHORIONIC FIBRIN.      -  MATURE THIRD TRIMESTER PLACENTA WITH 3 VESSEL CORD.      -  NEGATIVE FOR SIGNIFICANT ACUTE INFLAMMATION OR NEOPLASM.       Phoebe PerchJean E. Maisie Fushomas, M.D.  **Electronically Signed Out**         gs1/07/16/2013      Clinical Information  Pre-op Diagnosis:   28 Y/O G7 P2405 @ 36 5/7, MFMRCC IOL DUE TO IUGR,  OLIGOHYDRAMNIOS, NRFHT, ARREST OF DILATION  Operative Findings:  VIABLE FEMALE NEONATE APG 8/9, WT 5 LBS 7 OZ    Source of Specimen  1: PLACENTA AND MEMBRANES    Gross Descriptio                          n  "Khiana Iribe, UNDESIGNATED" Placenta with attached membranes and  umbilical cord.    UMBILICAL CORD        Length:     15 cm       Diameter:     1.5 1 cm  True knots:     No  Number of vessels:     3  Spiraling:     Normal  Insertion into fetal surface:     Paracentral    MEMBRANES  Color:     Tan-pink, focally opacified with a partial circummarginate  insertion over approximately 50% of the disc.  The remaining insertion  is marginal.  Meconium staining:     No    FETAL SURFACE  Color:     Purple-gray, with a normal array of surface vessels and the  surface is focally markedly disrupted.  Subchorionic fibrin:     Patchy and marginal and  involves  approximately 15% of the disc.    MATERNAL SURFACE  Cotyledons:           -All present and intact:     Fragment/torn (approximately 10%)  and completeness cannot be determined.  -Focal lesions:     No    Placental size:     21 x 16 x 3 cm  Shape:  Ovoid  Weight:     487 grams  Number of cassettes:     4cs           Microscopic Descr                          iption      Umbilical cord:     Unremarkable       Membranes:     Unremarkable  Meconium staining:     No  Infarcts:     No  Intervillous thrombi:     No  Subchorionic fibrin:     Focally increased  Villous maturation:     Appropriate  Nucleated erythrocytes in    villous capillaries:     Not increased  Other:     Few microcalcifications  TR-0       ??? WBC 07/16/2013 10.5  3.5 - 11.0 k/uL Final   ??? RBC 07/16/2013 3.01* 4.0 - 5.2 m/uL Final   ??? Hemoglobin 07/16/2013 9.3* 12.0 - 16.0 g/dL Final   ??? Hematocrit 07/16/2013 27.5* 36 - 46 % Final   ??? MCV 07/16/2013 91.6  80 - 100 fL Final   ??? MCH 07/16/2013 30.9  26 - 34 pg Final   ??? MCHC 07/16/2013 33.7  31 - 37 g/dL Final   ??? RDW 57/84/6962 13.2  12.5 - 15.4 % Final   ??? Platelets 07/16/2013 216  140 - 450 k/uL Final   ??? MPV 07/16/2013 8.9  6.0 - 12.0 fL Final   ??? Differential Type 07/16/2013 NOT REPORTED   Final   ??? Seg Neutrophils 07/16/2013 66  36 - 66 % Final   ??? Lymphocytes 07/16/2013 25  24 - 44 % Final   ??? Monocytes 07/16/2013 8  2 - 11 % Final   ??? Eosinophils Relative Percent 07/16/2013 1  1 - 4 % Final   ??? Basophils 07/16/2013 0  0 - 2 % Final   ??? Segs Absolute 07/16/2013 7.00  1.8 - 7.7 k/uL Final   ??? Absolute Lymph # 07/16/2013 2.60  1.0 - 4.8 k/uL Final   ??? Absolute Mono # 07/16/2013 0.80  0.1 - 1.2 k/uL Final   ??? Absolute Eos # 07/16/2013 0.10  0.0 - 0.4 k/uL Final   ??? Basophils Absolute 07/16/2013 0.00  0.0 - 0.2 k/uL Final    [facility] [address], [place] [address] [phone]   ??? WBC Morphology 07/16/2013 NOT REPORTED   Final   ??? RBC Morphology 07/16/2013 NOT REPORTED   Final    ??? Platelet Estimate 07/16/2013 NOT REPORTED   Final   ??? Hemoglobin 07/17/2013 9.1* 12.0 - 16.0 g/dL Final   ??? Hematocrit 07/17/2013 26.9* 36 - 46 % Final    [facility] [address], [place] [address] [phone]   ]Discharge Instructions for Cesarean Birth     During a cesarean section (C-section), an incision is made in the abdomen and uterus (womb) to deliver the baby. The normal hospital stay is 2-4 days.   Steps to Take   Home Care    ?? For the first 1-2 weeks, ask for someone to help you at home.    ?? Let people help you. Take frequent rest breaks.    ?? For vaginal bleeding, use extra absorbent pads.    ?? Keep the incision area clean and dry.    ?? Ask your doctor about when it is safe to shower, bathe,  or soak in water.    ?? Avoid heavy lifting for six weeks.    Diet    After a cesarean birth, you will start with a clear liquid diet. Examples include: Jell-o, broth, and ginger ale. If you tolerate that, you can slowly go back to your regular diet. Stay away from anything greasy or spicy right after surgery. These types of foods can upset your stomach. Drink lots of fluids to prevent constipation.   Physical Activity    ?? Do not lift anything heavier than your baby.    ?? When shifting positions, use a pillow to support the area where the incisions were made.    ?? Get up slowly. This will help you to avoid feeling dizzy or light headed.    ?? Try to move around each day. Light physical activity will help with your recovery.    ?? Ask your doctor when you will be able to go back to work.    ?? Do not drive unless your doctor has given you permission to do so. Do not drive if you are taking prescription pain medicine.    ?? Ask your doctor when you will be able to resume sexual activity. If you have not done so already, talk to your doctor about birth control options.    Medications    Your doctor may recommend pain medicine to ease discomfort.   If you are taking medicines, follow these general  guidelines:   ?? Take your medicine as directed. Do not change the amount or the schedule.   ?? Do not stop taking them without talking to your doctor.   ?? Do not share them.   ?? Know what the results and side effects. Report them to your doctor.   ?? Some drugs can be dangerous when mixed. Talk to a doctor or pharmacist if you are taking more than one drug. This includes over-the-counter medicine and herb or dietary supplements.   ?? Plan ahead for refills so you don't run out.   Lifestyle Changes    You and your doctor will plan lifestyle changes that will help you recover. To get encouragement and learn strategies, consider joining a support group for new mothers.   Follow-up   Make a follow-up appointment as directed by your doctor.   Call Your Doctor If Any of the Following Occurs   After you leave the hospital, call your doctor if any of the following occurs:   ?? Signs of infection, including fever and chills   ?? Heavy vaginal bleeding   ?? Foul-smelling vaginal discharge   ?? Excessive bleeding, redness, swelling, increasing pain or discharge from the incision site   ?? Nausea and/or vomiting that you cannot control with the medicines you were given after surgery, or which persist for more than two days after discharge from the hospital   ?? Pain that you cannot control with the medicines you have been given   ?? Swelling and/or pain in one or both legs   ?? Cough, shortness of breath, or chest pain   ?? Joint pain, fatigue, stiffness, rash, or other new symptoms   ?? Become dizzy or faint   If you think you have an emergency,  CALL 911  .         Home care, Restrictions,  Follow up Care, and birth control review completed    RTO 2 weeks    Secondary Smoke risks and Sudden Infant Death Syndrome were reviewed with  recommendations.    Signs and Symptoms of Post Partum Depression were reviewed. The patient is to call if any occur.    Signs and symptoms of Mastitis were reviewed. The patient is to call if any occur for  follow up.      Attending's Name:  Electronically signed by Manson Passey, DO on 07/17/2013 at 8:50 AM

## 2013-07-17 NOTE — Plan of Care (Signed)
Problem: Pain - Acute:  Goal: Pain level will decrease  Pain level will decrease   Outcome: Ongoing  Pt rates pain using a 0-10 scale    Problem: Discharge Planning:  Goal: Discharged to appropriate level of care  Discharged to appropriate level of care   Outcome: Not Met This Shift    Problem: Fluid Volume - Imbalance:  Goal: Absence of postpartum hemorrhage signs and symptoms  Absence of postpartum hemorrhage signs and symptoms   Outcome: Ongoing  No s/s of hemorrhage are noted    Problem: Infection - Surgical Site:  Goal: Will show no infection signs and symptoms  Will show no infection signs and symptoms   Outcome: Not Met This Shift  No s/s of infection are present

## 2013-07-17 NOTE — Progress Notes (Signed)
POST OPERATIVE DAY # 2    Brittney [name] is a 28 y.o. female   This patient was seen and examined today.     Her pregnancy was complicated by:   Patient Active Problem List   Diagnosis   ??? History of trichomoniasis   ??? Varicosities   ??? Depression   ??? History of gonorrhea   ??? Grand multipara   ??? Fetal demise, less than 22 weeks   ??? Smokes   ??? Subchorionic hematoma in first trimester, antepartum   ??? History of preterm labor, current pregnancy   ??? Pregnancy with other poor obstetric history(V23.49)   ??? Non-compliant pregnant patient   ??? Cervical shortening, antepartum condition or complication 03/19/2013 2.81 cm    ??? Circumvallate placenta visualized on 03/05/2013   ??? Unspecified antepartum hemorrhage, antepartum   ??? Circumvallate placenta in second trimester   ??? Feeling pelvic pressure in pregnancy, antepartum   ??? Circumvallate placenta   ??? Poor fetal growth, affecting management of mother, antepartum condition or complication   ??? Cervical shortening, antepartum condition or complication   ??? Oligohydramnios (6.35)   ??? IUGR (11%)   ??? PLTCS 07/15/13 F Apg 8,9 Wt. 5#7       Today she is doing well without any chief complaint. Her lochia is light. She denies chest pain, shortness of breath, headache, lightheadedness, peripheral edema and palpitations. She is not breast feeding and she denies any signs or symptoms of mastitis.  She is ambulating well. +spontaneous void. She currently denies S/S of postpartum depression. Flatus present.  Bowel movement absent. She is tolerating solids.    Vital Signs:  Filed Vitals:    07/16/13 0830 07/16/13 1230 07/16/13 1600 07/16/13 2000   BP: 111/57 117/75 123/74 125/85   Pulse: 90 100 95 90   Temp: 99 ??F (37.2 ??C) 99 ??F (37.2 ??C) 98.8 ??F (37.1 ??C) 97.9 ??F (36.6 ??C)   TempSrc: Oral Oral Oral    Resp: 18 18 18 18    Height:       Weight:       SpO2:                  Physical Exam:  General:  no apparent distress, alert and cooperative  Neurologic:  CN II-XII GI  Lungs:  CTA BL  Heart:   regular rate and rhythm and no murmur    Abdomen: abdomen soft, non-distended, non-tender  Fundus: non-tender, normal size, firm, below umbilicus  Incision: C/D/I with steris    Extremities:  no calf tenderness, non edematous, neg homans    Labs:  Lab Results   Component Value Date    WBC 10.5 07/16/2013    HGB 9.1* 07/17/2013    HCT 26.9* 07/17/2013    MCV 91.6 07/16/2013    PLT 216 07/16/2013       Assessment/Plan:  1. Brittney [name] is POD # 2 s/p PLTCS   - Doing well, VSS    - female infant in NICU - respiratory   - Encourage ambulation and use of incentive spirometer  2. Rh positive/Rubella immune  3. Bottle feeding   4. Continue post-op care.    Counseling Completed:  Secondary Smoke risks and Sudden Infant Death Syndrome were reviewed with recommendations. Infant sleeping, "back to sleep" and avoidance of co-sleeping recommendations were reviewed.  Signs and Symptoms of Post Partum Depression were reviewed. The patient is to call if any occur.  Signs and symptoms of Mastitis were reviewed.  The patient is to call if any occur for follow up.  Discharge instructions including pelvic rest, incision care, 15 lb weight restriction, no driving with pain medicine and office follow-up were reviewed with patient     Providers Name: Dr. Vivi FernsMorelli    Jeannine Pennisi, DO  Ob/Gyn Resident  07/17/2013, 7:00 AM

## 2013-07-18 MED ORDER — METRONIDAZOLE 500 MG PO TABS
500 MG | ORAL_TABLET | Freq: Two times a day (BID) | ORAL | Status: AC
Start: 2013-07-18 — End: 2013-07-21

## 2013-07-18 MED ORDER — IBUPROFEN 600 MG PO TABS
600 MG | ORAL_TABLET | Freq: Four times a day (QID) | ORAL | Status: DC | PRN
Start: 2013-07-18 — End: 2014-01-12

## 2013-07-18 MED ORDER — DSS 100 MG PO CAPS
100 MG | ORAL_CAPSULE | Freq: Two times a day (BID) | ORAL | Status: DC | PRN
Start: 2013-07-18 — End: 2013-07-29

## 2013-07-18 MED ORDER — OXYCODONE-ACETAMINOPHEN 5-325 MG PO TABS
5-325 MG | ORAL_TABLET | Freq: Four times a day (QID) | ORAL | Status: AC | PRN
Start: 2013-07-18 — End: 2013-07-24

## 2013-07-18 MED ORDER — FERROUS SULFATE 325 (65 FE) MG PO TABS
325 (65 Fe) MG | ORAL_TABLET | Freq: Every day | ORAL | Status: DC
Start: 2013-07-18 — End: 2014-01-12

## 2013-07-18 MED ADMIN — docusate sodium (COLACE) capsule 100 mg: 100 mg | ORAL | @ 13:00:00 | NDC 57896040110

## 2013-07-18 MED ADMIN — QUEtiapine (SEROQUEL XR) XR tablet 50 mg: 50 mg | ORAL | @ 01:00:00 | NDC 00310028060

## 2013-07-18 MED ADMIN — metroNIDAZOLE (FLAGYL) tablet 500 mg: 500 mg | ORAL | @ 01:00:00 | NDC 51079021717

## 2013-07-18 MED ADMIN — docusate sodium (COLACE) capsule 100 mg: 100 mg | ORAL | @ 01:00:00 | NDC 57896040110

## 2013-07-18 MED ADMIN — ferrous sulfate tablet 325 mg: 325 mg | ORAL | @ 13:00:00 | NDC 00603017932

## 2013-07-18 MED ADMIN — oxyCODONE-acetaminophen (PERCOCET) 5-325 MG per tablet 2 tablet: 2 | ORAL | @ 09:00:00 | NDC 00406051223

## 2013-07-18 MED FILL — DOK 100 MG PO CAPS: 100 MG | ORAL | Qty: 1

## 2013-07-18 MED FILL — OXYCODONE-ACETAMINOPHEN 5-325 MG PO TABS: 5-325 MG | ORAL | Qty: 2

## 2013-07-18 MED FILL — IBUPROFEN 600 MG PO TABS: 600 MG | ORAL | Qty: 1

## 2013-07-18 MED FILL — METRONIDAZOLE 500 MG PO TABS: 500 MG | ORAL | Qty: 1

## 2013-07-18 MED FILL — FERROUS SULFATE 325 (65 FE) MG PO TABS: 325 (65 Fe) MG | ORAL | Qty: 1

## 2013-07-18 MED FILL — SEROQUEL XR 50 MG PO TB24: 50 MG | ORAL | Qty: 1

## 2013-07-18 NOTE — Progress Notes (Signed)
Attending Physician Statement  I have discussed the care of Brittney [name], including pertinent history and exam findings,  with the resident. I have seen and examined the patient and the key elements of all parts of the encounter have been performed by me.  I agree with the assessment, plan and orders as documented by the resident.  [facility](GC Modifier)    Patient seen and examined. Agree with resident note.  Patient doing well.  Pain controlled.  Ambulating and tolerating PO.  +void, +flatus, +BM.  Minimal lochia.  Bottle feeding.  Denies F/C/HA/CP/SOB/N/V/Calf pain.    Filed Vitals:    07/16/13 2000 07/17/13 0800 07/17/13 2000 07/18/13 0800   BP: 125/85 122/69 122/77 137/82   Pulse: 90 93 91 91   Temp: 97.9 ??F (36.6 ??C) 98.1 ??F (36.7 ??C) 98.2 ??F (36.8 ??C) 97.9 ??F (36.6 ??C)   TempSrc:  Oral Oral Oral   Resp: 18 18 20 18    Height:       Weight:       SpO2:    100%     Gen: A&Ox3, NAD  CV: RRR no M/G/R  Resp: CTAB no W/R/R  Abd: soft, +BS, NT/ND, no R/R/G, incision C/D/I, fundus firm below umbilicus  Ext: no edema, no calf tenderness    Results for orders placed during the hospital encounter of 07/13/13   STREP B SCREEN, VAGINAL / RECTAL       Result Value Range    Specimen VAGINA      Special Requests NOT REPORTED      Culture NEGATIVE FOR GROUP B STREPTOCOCCI      Culture        Value: [facility] [address] [place], [place] [id number] [phone]    Status FINAL 07/17/2013     URINE CULTURE CLEAN CATCH       Result Value Range    Specimen CLEAN CATCH URINE      Special Requests NOT REPORTED      Culture NO SIGNIFICANT GROWTH      Culture        Value: [facility] [address] [place], [place] [id number] [phone]    Status FINAL 07/14/2013     CBC WITH AUTO DIFFERENTIAL       Result Value Range    WBC 8.9  3.5 - 11.0 k/uL    RBC 3.61 (*) 4.0 - 5.2 m/uL    Hemoglobin 11.1 (*) 12.0 - 16.0 g/dL    Hematocrit 57.832.0 (*) 36 - 46 %    MCV 88.8  80 - 100 fL    MCH 30.8  26 - 34 pg    MCHC 34.7  31 - 37 g/dL    RDW 46.913.4   62.912.5 - 52.815.4 %    Platelets 238  140 - 450 k/uL    MPV 9.2  6.0 - 12.0 fL    Differential Type NOT REPORTED      Seg Neutrophils 61  36 - 66 %    Lymphocytes 33  24 - 44 %    Monocytes 5  2 - 11 %    Eosinophils Relative Percent 1  1 - 4 %    Basophils 0  0 - 2 %    Segs Absolute 5.30  1.8 - 7.7 k/uL    Absolute Lymph # 3.00  1.0 - 4.8 k/uL    Absolute Mono # 0.50  0.1 - 1.2 k/uL    Absolute Eos # 0.10  0.0 -  0.4 k/uL    Basophils Absolute 0.00  0.0 - 0.2 k/uL    WBC Morphology NOT REPORTED      RBC Morphology NOT REPORTED      Platelet Estimate NOT REPORTED     T. PALLIDUM AB       Result Value Range    T. Pallidum Ab NONREACTIVE  NR   UA W/REFLEX CULTURE       Result Value Range    Color, UA YELLOW  YEL    Turbidity UA CLOUDY (*) CLEAR    Glucose, Ur 1+ (*) NEG    Bilirubin Urine NEGATIVE  NEG    Ketones, Urine SMALL (*) NEG    Specific Gravity, UA 1.017  1.005 - 1.030    Urine Hgb NEGATIVE  NEG    pH, UA 6.5  5.0 - 8.0    Protein, UA TRACE (*) NEG    Urobilinogen, Urine Normal  NORM    Nitrite, Urine NEGATIVE  NEG    Leukocyte Esterase, Urine SMALL (*) NEG    Urinalysis Comments Culture ordered based on defined criteria.     MICROSCOPIC URINALYSIS       Result Value Range    -           WBC, UA 10 TO 20  0 - 5 /HPF    RBC, UA 0 TO 2  0 - 2 /HPF    Casts UA NOT REPORTED  0 - 2 /LPF    Crystals UA NOT REPORTED  NONE /HPF    Epithelial Cells UA 10 TO 20  0 - 5 /HPF    Renal Epithelial, Urine NOT REPORTED  0 /HPF    Bacteria, UA FEW (*) NONE    Mucus, UA 1+ (*) NONE    Trichomonas, UA NOT REPORTED  NONE    Amorphous, UA NOT REPORTED  NONE    Other Observations UA NOT REPORTED  NREQ    Yeast, UA NOT REPORTED  NONE   BLOOD GAS, CORD BLOOD       Result Value Range    pH, Cord Art 7.264 (*) 7.30 - 7.40    pCO2, Cord Art 56.9 (*) 40 - 50 mmHg    pO2, Cord Art 18.5  15 - 25 mmHg    HCO3, Cord Art 24.9 (*) 29 - 39 mmol/L    Positive Base Excess, Cord, Art NOT REPORTED  0.0 - 2.0 mmol/L    Negative Base Excess, Cord, Art 3  (*) 0.0 - 2.0 mmol/L    O2 Sat, Cord Art NOT REPORTED      Carboxyhemoglobin NOT REPORTED      Methemoglobin NOT REPORTED  0.0 - 1.9 %    Text for Respiratory NOT REPORTED      pH, Cord Ven 7.324 (*) 7.35 - 7.45    pCO2, Cord Ven 44.2 (*) 28.0 - 40.0 mmHg    pO2, Cord Ven 26.6  21.0 - 31.0 mmHg    HCO3, Cord Ven 22.3  20 - 32 mmol/L    Positive Base Excess, Cord, Ven NOT REPORTED  0.0 - 2.0 mmol/L    Negative Base Excess, Cord, Ven 3 (*) 0.0 - 2.0 mmol/L    O2 Sat, Cord Ven NOT REPORTED      Carboxyhemoglobin NOT REPORTED      Methemoglobin NOT REPORTED  0.0 - 1.9 %   SURGICAL PATHOLOGY       Result Value Range    Surgical Pathology Report  Value: (NOTE)  [id number]  [facility]  ANATOMIC [facility]OLOGY  [address].  [place], [place] [address]  [phone]  Fax: [phone]    SURGICAL PATHOLOGY CONSULTATION    Patient Name: Brittney [name].  Med Rec: [id number]  Path Number: [id number]  Collected: 07/15/2013  Received: 07/15/2013  Reported: 07/16/2013 12:02    -- Diagnosis --  PLACENTA, CORD AND MEMBRANES, DELIVERY:      -  PARTIAL CIRCUMMARGINATE INSERTION OF MEMBRANES (50%).      -  MILD SUBCHORIONIC FIBRIN.      -  MATURE THIRD TRIMESTER PLACENTA WITH 3 VESSEL CORD.      -  NEGATIVE FOR SIGNIFICANT ACUTE INFLAMMATION OR NEOPLASM.       Phoebe Perch Brittney [name], M.D.  **Electronically Signed Out**         gs1/07/16/2013      Clinical Information  Pre-op Diagnosis:   28 Y/O G7 P2405 @ 36 5/7, MFMRCC IOL DUE TO IUGR,  OLIGOHYDRAMNIOS, NRFHT, ARREST OF DILATION  Operative Findings:  VIABLE FEMALE NEONATE APG 8/9, WT 5 LBS 7 OZ    Source of Specimen  1: PLACENTA AND MEMBRANES    Gross Descriptio      n  "Brittney [name], UNDESIGNATED" Placenta with attached membranes and  umbilical cord.    UMBILICAL CORD        Length:     15 cm       Diameter:     1.5 1 cm  True knots:     No  Number of vessels:     3  Spiraling:     Normal  Insertion into fetal surface:      Paracentral    MEMBRANES  Color:     Tan-pink, focally opacified with a partial circummarginate  insertion over approximately 50% of the disc.  The remaining insertion  is marginal.  Meconium staining:     No    FETAL SURFACE  Color:     Purple-gray, with a normal array of surface vessels and the  surface is focally markedly disrupted.  Subchorionic fibrin:     Patchy and marginal and involves  approximately 15% of the disc.    MATERNAL SURFACE  Cotyledons:           -All present and intact:     Fragment/torn (approximately 10%)  and completeness cannot be determined.  -Focal lesions:     No    Placental size:     21 x 16 x 3 cm  Shape:     Ovoid  Weight:     487 grams  Number of cassettes:     4cs           Microscopic Descr      iption      Umbilical cord:     Unremarkable       Membranes:     Unremarkable  Meconium staining:     No  Infarcts:     No  Intervillous thrombi:     No  Subchorionic fibrin:     Focally increased  Villous maturation:     Appropriate  Nucleated erythrocytes in    villous capillaries:     Not increased  Other:     Few microcalcifications  TR-0       CBC WITH AUTO DIFFERENTIAL       Result Value Range    WBC 10.5  3.5 - 11.0 k/uL  RBC 3.01 (*) 4.0 - 5.2 m/uL    Hemoglobin 9.3 (*) 12.0 - 16.0 g/dL    Hematocrit 40.9 (*) 36 - 46 %    MCV 91.6  80 - 100 fL    MCH 30.9  26 - 34 pg    MCHC 33.7  31 - 37 g/dL    RDW 81.1  91.4 - 78.2 %    Platelets 216  140 - 450 k/uL    MPV 8.9  6.0 - 12.0 fL    Differential Type NOT REPORTED      Seg Neutrophils 66  36 - 66 %    Lymphocytes 25  24 - 44 %    Monocytes 8  2 - 11 %    Eosinophils Relative Percent 1  1 - 4 %    Basophils 0  0 - 2 %    Segs Absolute 7.00  1.8 - 7.7 k/uL    Absolute Lymph # 2.60  1.0 - 4.8 k/uL    Absolute Mono # 0.80  0.1 - 1.2 k/uL    Absolute Eos # 0.10  0.0 - 0.4 k/uL    Basophils Absolute 0.00  0.0 - 0.2 k/uL    WBC Morphology NOT REPORTED      RBC Morphology NOT REPORTED      Platelet Estimate NOT REPORTED     HEMOGLOBIN AND  HEMATOCRIT, BLOOD       Result Value Range    Hemoglobin 9.1 (*) 12.0 - 16.0 g/dL    Hematocrit 95.6 (*) 36 - 46 %   TYPE AND SCREEN       Result Value Range    Expiration Date 07/16/2013      Arm Band Number [id number]      ABO/Rh O POSITIVE      Antibody Screen NEGATIVE           Assessment/Plan:  1. Brittney Pulse is POD # 3 s/p PLTCS Female  - Doing well, VSS   - Infant now in well baby nursery  - Encourage ambulation and use of incentive spirometer  - d/c to home today. Instructions/Restrictions rev'd and understood.  2. Rh+/Rubella immune  3. Bottle feeding  4. BV  - Flagyl  5. Anemia of acute blood loss  - ASx  - repeat H/H in 2wks  6. Bipolar disorder  - Seroquel continued  - Stable, denies SI/HI  7. Tobacco abuse  - encourage cessation    Reviewed discharge instructions, home care, follow up, S/S of infection/PP depression/SIDS.  Pt to call with questions/concerns - pt voiced her understanding.    Pamella Samons [place] Kiaan Overholser DO

## 2013-07-18 NOTE — Progress Notes (Signed)
POST OPERATIVE DAY # 3    Brittney PulseShalonda L Devaul is a 28 y.o. female   This patient was seen and examined today.     Her pregnancy was complicated by:   Patient Active Problem List   Diagnosis   ??? History of trichomoniasis   ??? Varicosities   ??? Depression   ??? History of gonorrhea   ??? Grand multipara   ??? Fetal demise, less than 22 weeks   ??? Smokes   ??? Subchorionic hematoma in first trimester, antepartum   ??? History of preterm labor, current pregnancy   ??? Pregnancy with other poor obstetric history(V23.49)   ??? Non-compliant pregnant patient   ??? Cervical shortening, antepartum condition or complication 03/19/2013 2.81 cm    ??? Circumvallate placenta visualized on 03/05/2013   ??? Unspecified antepartum hemorrhage, antepartum   ??? Circumvallate placenta in second trimester   ??? Feeling pelvic pressure in pregnancy, antepartum   ??? Circumvallate placenta   ??? Poor fetal growth, affecting management of mother, antepartum condition or complication   ??? Cervical shortening, antepartum condition or complication   ??? Oligohydramnios (6.35)   ??? IUGR (11%)   ??? PLTCS 07/15/13 F Apg 8,9 Wt. 5#7       Today she is doing well without any chief complaint. Her lochia is light. She denies chest pain, shortness of breath, headache, lightheadedness, peripheral edema, palpitations, F/C. She is bottle feeding.  She is ambulating well. +spontaneous void. She currently denies S/S of postpartum depression. Flatus present. No Bowel movement. She is tolerating solids. Desires d/c to home today.    Vital Signs:  Filed Vitals:    07/16/13 1600 07/16/13 2000 07/17/13 0800 07/17/13 2000   BP: 123/74 125/85 122/69 122/77   Pulse: 95 90 93 91   Temp: 98.8 ??F (37.1 ??C) 97.9 ??F (36.6 ??C) 98.1 ??F (36.7 ??C) 98.2 ??F (36.8 ??C)   TempSrc: Oral  Oral Oral   Resp: 18 18 18 20    Height:       Weight:       SpO2:             Physical Exam:  General:  no apparent distress, alert and cooperative  Lungs:  CTA BL  Heart:  regular rate and rhythm and no murmur    Abdomen: abdomen  soft, non-distended, non-tender, ff below umbilicus, inc c/d/i  Extremities:  non edematous, neg homans        Assessment/Plan:  1. Brittney [name] is POD # 3 s/p PLTCS Female   - Doing well, VSS    - Infant now in well baby nursery   - Encourage ambulation and use of incentive spirometer   - anticipate d/c to home today. Instructions/Restrictions rev'd and understood.  2. Rh+/Rubella immune  3. Bottle feeding   4. BV   - Flagyl  5. Anemia of acute blood loss   - ASx   - repeat H/H in 2wks  6. Bipolar disorder   - Seroquel continued   - Stable, denies SI/HI  7. Tobacco abuse   - encourage cessation      Providers Name: Dr. Lisabeth RegisterMorelli    Jehan Ranganathan Kerry DoryAE Dyllon Henken D.O., DO

## 2013-07-29 LAB — POCT URINE PREGNANCY: Preg Test, Ur: NEGATIVE

## 2013-07-29 MED ADMIN — medroxyPROGESTERone (DEPO-PROVERA) injection 150 mg: 150 mg | INTRAMUSCULAR | @ 17:00:00 | NDC 00009074630

## 2013-07-29 NOTE — Progress Notes (Signed)
Brittney [name]  12:28 PM  07/29/2013            The patient was seen. She has no chief complaints today. She delivered by cesarean section on 2 weeks ago. She is not breast feeding and there is not any signs or symptoms of mastitis.  The patient completed the E.P.D.S. Evaluation form and scored DECLINED.  She does not have any signs or symptoms of post partum depression. She denies any intent to harm others and delusional ideas. Today her lochia is light she denies any dizziness or shortness of breath.  States she goes to counseling and takes medication for her depression. Requesting BC depo provera injection states she was on it before without problems.     Her pregnancy was complicated by:   Patient Active Problem List    Diagnosis Date Noted   ??? Grand multipara 01/06/2013     Priority: High   ??? Fetal demise, less than 22 weeks 01/06/2013     Priority: High   ??? Smokes 01/06/2013     Priority: High     Overview Note:     Discussed smoking cessation     ??? History of gonorrhea      Priority: Medium   ??? History of trichomoniasis      Priority: Medium   ??? Varicosities      Priority: Low     Overview Note:     LEGS, WORSE WHEN PREGNANT     ??? Depression      Priority: Low     Overview Note:     BIPOLAR, SEVERE DEPRESSION ANXIETY, Ordered to go to counseling but refuses admission, reports ? suicidal  Attempts in pass, refuses medication.     ??? PLTCS 07/15/13 F Apg 8,9 Wt. 5#7 07/15/2013         She does admit to having good home support. Her bowels are regular and she denies any urinary tract symptomology.    Obstetric History    G7   P7   T2   P5   A0   TAB0   SAB0   E0   M0   L6            Blood pressure 124/80, pulse 74, resp. rate 16, height 5\' 4"  (1.626 m), weight 127 lb (57.607 kg), not currently breastfeeding.    Abdomen: Soft and non-tender; good bowel sounds; no guarding, rebound or rigidity; no CVA tenderness bilaterally.    Incision: Clean, Dry and Intact without signs or symptoms of  infection.    Extremities: No calf tenderness bilaterally. DTR 2/4 bilaterally. No edema.      Assessment:  1. Pregnancy examination or test, negative result  POCT urine pregnancy   2. Family planning  medroxyPROGESTERone (DEPO-PROVERA) injection 150 mg    PR INJECTION,THERAP/PROPH/DIAGNOST, IM OR SUBCUT   3. H/O: C-section       Chief Complaint   Patient presents with   ??? Postpartum Care     EPDS Score of DECLINED to complete        Plan:  1. Return to the office in  3-4 weeks  2. Signs & Symptoms of mastitis reviewed; notify if occurs  3. Secondary smoke risks reviewed. Increased risks of respiratory problems, Sudden     infant death syndrome, and potential malignancies.  4. Abstinence  5. Family planning counseling and STD counseling completed  6. Continue with post operative restrictions  7. No lifting or Intercourse  8. HRT consent  signed  9. UPT negative  10 . Depo Provera injection given (RTO in 12 weeks for next injection)

## 2013-10-20 LAB — POCT URINE PREGNANCY: Preg Test, Ur: NEGATIVE

## 2013-10-20 MED ADMIN — medroxyPROGESTERone (DEPO-PROVERA) injection 150 mg: 150 mg | INTRAMUSCULAR | @ 16:00:00 | NDC 59762453701

## 2013-10-20 NOTE — Progress Notes (Signed)
Pt given depo provera 150 mg IM in R del per [place], Lot #[id number] exp 1/18. UPT neg, lot #[id number] exp 11/16.

## 2014-01-12 ENCOUNTER — Ambulatory Visit
Admit: 2014-01-12 | Discharge: 2014-01-12 | Payer: PRIVATE HEALTH INSURANCE | Attending: Advanced Practice Midwife | Primary: Internal Medicine

## 2014-01-12 DIAGNOSIS — Z3202 Encounter for pregnancy test, result negative: Secondary | ICD-10-CM

## 2014-01-12 MED ORDER — MEDROXYPROGESTERONE ACETATE 150 MG/ML IM SUSP
150 MG/ML | Freq: Once | INTRAMUSCULAR | Status: AC
Start: 2014-01-12 — End: 2014-01-13
  Administered 2014-01-13: 13:00:00 150 mg via INTRAMUSCULAR

## 2014-01-12 NOTE — Progress Notes (Signed)
History and Physical  [facility]  [facility] [address]  [place], [place]  [address] [phone]   Fax [phone]  Brittney [name]  01/12/2014              28 y.o.  Chief Complaint   Patient presents with   ??? Annual Exam   ??? Injections       Patient's last menstrual period was 12/31/2013.             Primary Care Physician: No primary provider on file.    The patient was seen and examined. She complains of lump in left axillary today and is here for her annual exam.  Her bowels are regular. There are no voiding complaints. She denies any bloating.  She denies vaginal discharge and was counseled on STD's and the need for barrier contraception.     HPI : Brittney [name] is a 28 y.o. female [id number]    ANNUAL EXAM, DEPO injection  Lump in left axillary region  ________________________________________________________________________  Obstetric History    G7   P7   T2   P5   A0   TAB0   SAB0   E0   M0   L6       # Outcome Date GA Lbr Len/2nd Weight Sex Delivery Anes PTL Lv   7 Preterm 07/15/13 [redacted]w[redacted]d  5 lb 7 oz (2.466 kg) F CS-LTranv Spinal N Y      Complications: Oligohydramnios,Circumvallate placenta,IUGR (intrauterine growth restriction),Arrest of dilation, delivered, current hospitalization   6 Term 03/22/11 [redacted]w[redacted]d  5 lb 5 oz (2.41 kg) F Vag-Spont  N Y   5 Preterm 03/30/10 [redacted]w[redacted]d  7.9 oz (0.225 kg) M Vag-Spont  Y ND   4 Term 06/22/09 [redacted]w[redacted]d  6 lb 8 oz (2.948 kg) F Vag-Spont   Y   3 Preterm 08/28/07 [redacted]w[redacted]d  4 lb 1 oz (1.843 kg) F Vag-Spont  Y Y   2 Preterm 07/21/06 [redacted]w[redacted]d  6 lb 5 oz (2.863 kg) M Brittney [name]   1 Preterm 12/02/03 [redacted]w[redacted]d  4 lb 5 oz (1.956 kg) M Vag-Spont   Y        Past Medical History   Diagnosis Date   ??? History of trichomoniasis 03/29/10   ??? 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 Depression      BIPOLAR, SEVERE DEPRESSION ANXIETY   ??? Mild preeclampsia delivered 05,     ELEVATED BP IN EVERY PREGNANCY   ??? Left fetal pyelectasis, antepartum 02/14/2011   ??? Mild  preeclampsia delivered    ??? History of gonorrhea 05/30/12     tx'd Rocephin 250 mg IM 05/30/12   ??? History of low transverse cesarean section 07/15/13                                                                   Past Surgical History   Procedure Laterality Date   ??? Cesarean section, low transverse  07/15/13     Family History   Problem Relation Age of Onset   ??? Seizures Mother      HEAD TRAMA   ??? Depression Mother    ??? Breast Cancer Neg Hx    ???  Cancer Neg Hx    ??? Colon Cancer Neg Hx    ??? Diabetes Neg Hx    ??? Eclampsia Neg Hx    ??? Hypertension Neg Hx    ??? Ovarian Cancer Neg Hx    ??? Preterm Labor Neg Hx    ??? Spont Abortions Neg Hx    ??? Stroke Neg Hx    ??? Cervical Cancer Neg Hx    ??? Lupus Neg Hx      History     Social History   ??? Marital Status: Single     Spouse Name: N/A     Number of Children: N/A   ??? Years of Education: N/A     Occupational History   ??? Not on file.     Social History Main Topics   ??? Smoking status: Current Every Day Smoker -- 0.50 packs/day for 7 years   ??? Smokeless tobacco: Never Used      Comment: "<1/2pk/day"   ??? Alcohol Use: No   ??? Drug Use: No   ??? Sexual Activity:     Partners: Male     Other Topics Concern   ??? Not on file     Social History Narrative       MEDICATIONS:  Current Outpatient Prescriptions   Medication Sig Dispense Refill   ??? SEROQUEL XR 50 MG XR tablet   0     Current Facility-Administered Medications   Medication Dose Route Frequency Provider Last Rate Last Dose   ??? medroxyPROGESTERone (DEPO-PROVERA) injection 150 mg  150 mg Intramuscular Once Coca ColaJulie Beam, CNM               ALLERGIES:  Allergies as of 01/12/2014 - Review Complete 01/12/2014   Allergen Reaction Noted   ??? Other Hives 02/03/2013       Symptoms of decreased mood absent    **If either question is answered in a  positive fashion then complete the PHQ9 Scoring Evaluation and make the appropriate referral**      Immunization status: up to date and documented, stated as current, but no records  available.      Gynecologic History:  Menarche: 28 yo  Menopause at NA yo     Patient's last menstrual period was 12/31/2013.    Sexually Active: Yes    STD History: YES, HX gonorrhea, trichomoniasis    Permanent Sterilization: No   Reversible Birth Control: Yes DEPO        Hormone Replacement Exposure: No      Genetic Qualified Family History of Breast, Ovarian , Colon or Uterine Cancer: No     If YES see scanned worksheet.    Preventative Health Testing:  Date of Last Pap Smear: 01/2013 normal  Abnormal Pap Smear History:   Colposcopy History:   Date of Last Mammogram: NA  Date of Last Colonoscopy:   Date of Last Bone Density:      ________________________________________________________________________  REVIEW OF SYSTEMS:    yes   A minimum of an eleven point review of systems was completed.    Review Of Systems (11 point):  Constitutional: No fever, chills or malaise; No weight change or fatigue  Head and Eyes: No vision, Headache, Dizziness or trauma in last 12 months  ENT ROS: No hearing, Tinnitis, sinus or taste problems  Hematological and Lymphatic ROS:No Lymphoma, Von Willebrand's, Hemophillia or Bleeding History  Psych ROS: No Depression, Homicidal thoughts,suicidal thoughts, or anxiety  Breast ROS: No prior breast abnormalities or lumps  Respiratory ROS: No SOB, Pneumoniae,Cough, or Pulmonary Embolism History  Cardiovascular ROS: No Chest Pain with Exertion, Palpitations, Syncope, Edema, Arrhythmia  Gastrointestinal ROS: No Indigestion, Heartburn, Nausea, vomiting, Diarrhea, Constipation,or Bowel Changes; No Bloody Stools or melena  Genito-Urinary ROS: No Dysuria, Hematuria or Nocturia. No Urinary Incontinence or Vaginal Discharge  Musculoskeletal ROS: No Arthralgia, Arthritis,Gout,Osteoporosis or Rheumatism  Neurological ROS: No CVA, Migraines, Epilepsy, Seizure Hx, or Limb Weakness  Dermatological ROS: No Rash, Itching, Hives, Mole Changes or Cancer                                                                                                                                                                                                                                   PHYSICAL Exam:     Constitutional:  Filed Vitals:    01/12/14 0937   BP: 126/74   Pulse: 70   Resp: 16   Height: 5\' 4"  (1.626 m)   Weight: 130 lb (58.968 kg)       General Appearance:  This  is a well Developed, well Nourished, well groomed female.      Her BMI was reviewed. Nutritional decision making was discussed.    Skin:  There was a Normal Inspection of the skin without rashes or lesions.  There were no rashes.  (Papular, Maculopapular, Hives, Pustular, Macular)     There were no lesions (Ulcers, Erythema, Abn. Appearing Nevi)            Lymphatic:  No Lymph Nodes were Palpable in the neck , axilla or groin.  1 # Of Lymph Nodes; Location LEFT axillary; Character slightly Enlarged-size of pea, non tender with palpation.     Neck and EENT:  The neck was supple. There were no masses   The thyroid was not enlarged and had no masses.  Perrla, EOMI B/L, TMI B/L No Abnormalities.   Throat inspected-No exudates or Masses, Nares Patent No Masses        Respiratory:  The lungs were auscultated and found to be clear. There were no rales, rhonchi or wheezes. There was a good respiratory effort.    Cardiovascular:  The heart was in a regular rate and rhythm. . No S3 or S4. There was no murmur appreciated. Location, grade, and radiation are not applicable.     Extremities:  The patients extremities were without calf tenderness, edema, or varicosities.  There was full range of motion in  all four extremities. Pulses in all four extremities were appreciated and are 2/4.    Abdomen:  The abdomen was soft and non-tender. There were good bowel sounds in all quadrants and there was no guarding, rebound or rigidity.  On evaluation there was no evidence of hepatosplenomegaly and there was no costal vertebral angel tenderness bilaterally.  No hernias were  appreciated.     Abdominal Scars: low transverse c-section scar    Psych:  The patient had a normal Orientation to: Time, Place, Person, and Situation  There is no Mood / Affect changes    Breast:  (Chest)  normal appearance,no tenderness, small nontender mass noted in left axillary region  Self breast exams were reviewed in detail. Literature was given.    Pelvic Exam:  Vulva and vagina appear normal. Bimanual exam reveals normal uterus and adnexa.    Rectal Exam:  exam declined by patient          Musculosk:  Normal Gait and station was noted.  Digits were evaluated without abnormal findings.  Range of motion, stability and strength were evaluated and found to be appropriate for the patients age.          History and Physical  [facility]  [facility] [address], [address]  [place], [place]  [address] [phone]   Fax [phone]  SENORA LACSON  01/12/2014              28 y.o.  Chief Complaint   Patient presents with   ??? Annual Exam   ??? Injections       Patient's last menstrual period was 12/31/2013.             Primary Care Physician: No primary provider on file.    The patient was seen and examined. She complains of lump in left axillary today and is here for her annual exam.  Her bowels are regular. There are no voiding complaints. She denies any bloating.  She denies vaginal discharge and was counseled on STD's and the need for barrier contraception.     HPI : Brittney [name] is a 28 y.o. female [phone]    Annual exam  Depo  Lump in left axillary  ________________________________________________________________________  Obstetric History    G7   P7   T2   P5   A0   TAB0   SAB0   E0   M0   L6       # Outcome Date GA Lbr Len/2nd Weight Sex Delivery Anes PTL Lv   7 Preterm 07/15/13 [redacted]w[redacted]d  5 lb 7 oz (2.466 kg) F CS-LTranv Spinal N Y      Complications: Oligohydramnios,Circumvallate placenta,IUGR (intrauterine growth restriction),Arrest of dilation, delivered, current hospitalization   6  Term 03/22/11 [redacted]w[redacted]d  5 lb 5 oz (2.41 kg) F Vag-Spont  N Y   5 Preterm 03/30/10 [redacted]w[redacted]d  7.9 oz (0.225 kg) M Vag-Spont  Y ND   4 Term 06/22/09 [redacted]w[redacted]d  6 lb 8 oz (2.948 kg) F Vag-Spont   Y   3 Preterm 08/28/07 [redacted]w[redacted]d  4 lb 1 oz (1.843 kg) F Vag-Spont  Y Y   2 Preterm 07/21/06 [redacted]w[redacted]d  6 lb 5 oz (2.863 kg) M Brittney [name]   1 Preterm 12/02/03 [redacted]w[redacted]d  4 lb 5 oz (1.956 kg) M Vag-Spont   Y        Past Medical History   Diagnosis Date   ??? History of trichomoniasis  03/29/10   ??? 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 Depression      BIPOLAR, SEVERE DEPRESSION ANXIETY   ??? Mild preeclampsia delivered 05,     ELEVATED BP IN EVERY PREGNANCY   ??? Left fetal pyelectasis, antepartum 02/14/2011   ??? Mild preeclampsia delivered    ??? History of gonorrhea 05/30/12     tx'd Rocephin 250 mg IM 05/30/12   ??? History of low transverse cesarean section 07/15/13                                                                   Past Surgical History   Procedure Laterality Date   ??? Cesarean section, low transverse  07/15/13     Family History   Problem Relation Age of Onset   ??? Seizures Mother      HEAD TRAMA   ??? Depression Mother    ??? Breast Cancer Neg Hx    ??? Cancer Neg Hx    ??? Colon Cancer Neg Hx    ??? Diabetes Neg Hx    ??? Eclampsia Neg Hx    ??? Hypertension Neg Hx    ??? Ovarian Cancer Neg Hx    ??? Preterm Labor Neg Hx    ??? Spont Abortions Neg Hx    ??? Stroke Neg Hx    ??? Cervical Cancer Neg Hx    ??? Lupus Neg Hx      History     Social History   ??? Marital Status: Single     Spouse Name: N/A     Number of Children: N/A   ??? Years of Education: N/A     Occupational History   ??? Not on file.     Social History Main Topics   ??? Smoking status: Current Every Day Smoker -- 0.50 packs/day for 7 years   ??? Smokeless tobacco: Never Used      Comment: "<1/2pk/day"   ??? Alcohol Use: No   ??? Drug Use: No   ??? Sexual Activity:     Partners: Male     Other Topics Concern   ??? Not on file     Social History Narrative       MEDICATIONS:  Current Outpatient Prescriptions    Medication Sig Dispense Refill   ??? SEROQUEL XR 50 MG XR tablet   0     Current Facility-Administered Medications   Medication Dose Route Frequency Provider Last Rate Last Dose   ??? medroxyPROGESTERone (DEPO-PROVERA) injection 150 mg  150 mg Intramuscular Once [employer], CNM               ALLERGIES:  Allergies as of 01/12/2014 - Review Complete 01/12/2014   Allergen Reaction Noted   ??? Other Hives 02/03/2013       Symptoms of decreased mood absent    **If either question is answered in a  positive fashion then complete the PHQ9 Scoring Evaluation and make the appropriate referral**      Immunization status: up to date and documented, stated as current, but no records available.      Gynecologic History:  Menarche: 28 yo  Menopause at na yo     Patient's last menstrual period was 12/31/2013.  Sexually Active: Yes    STD History: No     Permanent Sterilization: No   Reversible Birth Control: Yes depo        Hormone Replacement Exposure: No      Genetic Qualified Family History of Breast, Ovarian , Colon or Uterine Cancer: No     If YES see scanned worksheet.    Preventative Health Testing:  Date of Last Pap Smear: 01/2013  Abnormal Pap Smear History: na  Colposcopy History:   Date of Last Mammogram: na  Date of Last Colonoscopy:   Date of Last Bone Density:      ________________________________________________________________________  REVIEW OF SYSTEMS:    yes  A minimum of an eleven point review of systems was completed.    Review Of Systems (11 point):  Constitutional: No fever, chills or malaise; No weight change or fatigue  Head and Eyes: No vision, Headache, Dizziness or trauma in last 12 months  ENT ROS: No hearing, Tinnitis, sinus or taste problems  Hematological and Lymphatic ROS:No Lymphoma, Von Willebrand's, Hemophillia or Bleeding History  Psych ROS: No Depression, Homicidal thoughts,suicidal thoughts, or anxiety  Breast ROS: No prior breast abnormalities or lumps  Respiratory ROS: No SOB,  Pneumoniae,Cough, or Pulmonary Embolism History  Cardiovascular ROS: No Chest Pain with Exertion, Palpitations, Syncope, Edema, Arrhythmia  Gastrointestinal ROS: No Indigestion, Heartburn, Nausea, vomiting, Diarrhea, Constipation,or Bowel Changes; No Bloody Stools or melena  Genito-Urinary ROS: No Dysuria, Hematuria or Nocturia. No Urinary Incontinence or Vaginal Discharge  Musculoskeletal ROS: No Arthralgia, Arthritis,Gout,Osteoporosis or Rheumatism  Neurological ROS: No CVA, Migraines, Epilepsy, Seizure Hx, or Limb Weakness  Dermatological ROS: No Rash, Itching, Hives, Mole Changes or Cancer                                                                                                                                                                                                                                  PHYSICAL Exam:     Constitutional:  Filed Vitals:    01/12/14 0937   BP: 126/74   Pulse: 70   Resp: 16   Height: 5\' 4"  (1.626 m)   Weight: 130 lb (58.968 kg)       General Appearance:  This  is a well Developed, well Nourished, well groomed female.      Her BMI was reviewed. Nutritional decision making was discussed.    Skin:  There was a Normal Inspection of the  skin without rashes or lesions.  There were no rashes.  (Papular, Maculopapular, Hives, Pustular, Macular)     There were no lesions (Ulcers, Erythema, Abn. Appearing Nevi)            Lymphatic:  No Lymph Nodes were Palpable in the neck , axilla or groin.  1 # Of Lymph Nodes; Location left axillary ; slightly enlarged- size of pea, nontender     Neck and EENT:  The neck was supple. There were no masses   The thyroid was not enlarged and had no masses.  Perrla, EOMI B/L, TMI B/L No Abnormalities.   Throat inspected-No exudates or Masses, Nares Patent No Masses        Respiratory:  The lungs were auscultated and found to be clear. There were no rales, rhonchi or wheezes. There was a good respiratory effort.    Cardiovascular:  The heart was in a  regular rate and rhythm. . No S3 or S4. There was no murmur appreciated. Location, grade, and radiation are not applicable.     Extremities:  The patients extremities were without calf tenderness, edema, or varicosities.  There was full range of motion in all four extremities. Pulses in all four extremities were appreciated and are 2/4.    Abdomen:  The abdomen was soft and non-tender. There were good bowel sounds in all quadrants and there was no guarding, rebound or rigidity.  On evaluation there was no evidence of hepatosplenomegaly and there was no costal vertebral angel tenderness bilaterally.  No hernias were appreciated.     Abdominal Scars: low transverse c/s    Psych:  The patient had a normal Orientation to: Time, Place, Person, and Situation  There is no Mood / Affect changes    Breast:  (Chest)  normal appearance, left axillary mass, nontender  Self breast exams were reviewed in detail. Literature was given.    Pelvic Exam:  Vulva and vagina appear normal. Bimanual exam reveals normal uterus and adnexa.    Rectal Exam:  exam declined by patient          Musculosk:  Normal Gait and station was noted.  Digits were evaluated without abnormal findings.  Range of motion, stability and strength were evaluated and found to be appropriate for the patients age.                  ASSESSMENT:      28 y.o. Annual  1. Negative pregnancy test  POCT urine pregnancy   2. Well female exam with routine gynecological exam  GYN Cytology   3. Family planning  PR INJECTION,THERAP/PROPH/DIAGNOST, IM OR SUBCUT    medroxyPROGESTERone (DEPO-PROVERA) injection 150 mg   4. Axillary lump, left  US Breast Left   5. Depression screening  Negative Screen for Clinical Depression, Follow-up not Required [phone]          Chief Complaint   Patient presents with   ??? Annual Exam   ??? Injections          Past Medical History   Diagnosis Date   ??? History of trichomoniasis 03/29/10   ??? 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 Depression       BIPOLAR, SEVERE DEPRESSION ANXIETY   ??? Mild preeclampsia delivered 05,     ELEVATED BP IN EVERY PREGNANCY   ??? Left fetal pyelectasis, antepartum 02/14/2011   ??? Mild preeclampsia delivered    ??? History of gonorrhea 05/30/12  tx'd Rocephin 250 mg IM 05/30/12   ??? History of low transverse cesarean section 07/15/13         Patient Active Problem List    Diagnosis Date Noted   ??? Grand multipara 01/06/2013     Priority: High   ??? Fetal demise, less than 22 weeks 01/06/2013     Priority: High   ??? Smokes 01/06/2013     Priority: High     Discussed smoking cessation     ??? History of gonorrhea      Priority: Medium   ??? History of trichomoniasis      Priority: Medium   ??? Varicosities      Priority: Low     LEGS, WORSE WHEN PREGNANT     ??? Depression      Priority: Low     BIPOLAR, SEVERE DEPRESSION ANXIETY, Ordered to go to counseling but refuses admission, reports ? suicidal  Attempts in pass, refuses medication.     ??? PLTCS 07/15/13 F Apg 8,9 Wt. 5#7 07/15/2013          Hereditary Breast, Ovarian, Colon and Uterine Cancer screening Done.          Tobacco & Secondary smoke risks reviewed; instructed on cessation and avoidance      Counseling Completed:  Preventative Health Recommendations and Follow up.            PLAN:  Return in about 1 year (around 01/13/2015) for annual.   Ultrasound for left breast  Depo given  Repeat Annual every 1 year  Cervical Cytology Evaluation begins at 27 years old.  If Negative Cytology, Follow-up screening per current guidelines.   Mammograms every 1 year. If 28 yo and last mammogram was negative.  Calcium and Vitamin D dosing reviewed.  Colonoscopy screening reviewed as well as onset for bone density testing.  Birth control and barrier recommendations discussed.  STD counseling and prevention reviewed.  Gardisil counseling completed for all patients 9-26 yo.  Routine health maintenance per patients PCP.  Orders Placed This Encounter   Procedures   ??? US Breast Left     Standing Status: Future       Number of Occurrences:       Standing Expiration Date: 01/13/2015     Order Specific Question:  Reason for exam:     Answer:  left breast lump   ??? GYN Cytology     Order Specific Question:  Collection Type?     Answer:  ( 2) Thin prep     Order Specific Question:  Diagnostic or Screening?     Answer:  ( 1) Screening     Order Specific Question:  HPV Requested?     Answer:  ( 5) HPV if ASCUS     Order Specific Question:  Present Status     Answer:  N/A     Order Specific Question:  Hysterectomy?     Answer:  N/A     Order Specific Question:  Hormone Replacement?     Answer:  N/A     Order Specific Question:  Birth Control     Answer:  ( 8) Depoprovera     Order Specific Question:  Present Illness     Answer:  (12) No present illness     Order Specific Question:  Specify Illness if other     Answer:  n/a     Order Specific Question:  HPV Status?     Answer:  N/A  Order Specific Question:  Clinical History     Answer:  N/A     Order Specific Question:  Specify Clinical History if other     Answer:  n/a     Order Specific Question:  Last Menstrual Period     Answer:  12/31/13     Order Specific Question:  Last PAP Smear     Answer:  01/06/13     Order Specific Question:  Prior Abnormal Smear     Answer:  (13) No prior abnormal smear     Order Specific Question:  If Prior Abnormal, Give Date     Answer:  n/a     Order Specific Question:  Prior Treatment     Answer:  N/A     Order Specific Question:  Specify Treatment if other     Answer:  n/a     Order Specific Question:  Specimen Source     Answer:  ( 1) Cervical     Order Specific Question:  Specify Source if other     Answer:  n/a   ??? POCT urine pregnancy   ??? PR INJECTION,THERAP/PROPH/DIAGNOST, IM OR SUBCUT   ??? Negative Screen for Clinical Depression, Follow-up not Required [phone]               ASSESSMENT:      28 y.o. Annual  1. Negative pregnancy test  POCT urine pregnancy   2. Well female exam with routine gynecological exam  GYN Cytology   3. Family planning  PR  INJECTION,THERAP/PROPH/DIAGNOST, IM OR SUBCUT    medroxyPROGESTERone (DEPO-PROVERA) injection 150 mg   4. Axillary lump, left  US Breast Left   5. Depression screening  Negative Screen for Clinical Depression, Follow-up not Required [phone]          Chief Complaint   Patient presents with   ??? Annual Exam   ??? Injections          Past Medical History   Diagnosis Date   ??? History of trichomoniasis 03/29/10   ??? 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 Depression      BIPOLAR, SEVERE DEPRESSION ANXIETY   ??? Mild preeclampsia delivered 05,     ELEVATED BP IN EVERY PREGNANCY   ??? Left fetal pyelectasis, antepartum 02/14/2011   ??? Mild preeclampsia delivered    ??? History of gonorrhea 05/30/12     tx'd Rocephin 250 mg IM 05/30/12   ??? History of low transverse cesarean section 07/15/13         Patient Active Problem List    Diagnosis Date Noted   ??? Grand multipara 01/06/2013     Priority: High   ??? Fetal demise, less than 22 weeks 01/06/2013     Priority: High   ??? Smokes 01/06/2013     Priority: High     Discussed smoking cessation     ??? History of gonorrhea      Priority: Medium   ??? History of trichomoniasis      Priority: Medium   ??? Varicosities      Priority: Low     LEGS, WORSE WHEN PREGNANT     ??? Depression      Priority: Low     BIPOLAR, SEVERE DEPRESSION ANXIETY, Ordered to go to counseling but refuses admission, reports ? suicidal  Attempts in pass, refuses medication.     ??? PLTCS 07/15/13 F Apg 8,9 Wt. 5#7 07/15/2013  Hereditary Breast, Ovarian, Colon and Uterine Cancer screening Done.          Tobacco & Secondary smoke risks reviewed; instructed on cessation and avoidance      Counseling Completed:  Preventative Health Recommendations and Follow up.  The patient was seen and counseled on all forms of birth control both female and female  reversible and non. She is aware that hormonal based birth control may increase her risk of developing a blood clot which may increase her morbidity and or mortality. She was  counseled on alternate non hormonal based contraception options.  We discussed that smoking and any hormonal based contraception may increase the patients risks of developing these life threatening blood clots. All patients are encouraged to stop smoking at the time of contraceptive counseling.  Cessation programs were reviewed.    The patient was instructed to use barrier contraception for sexually transmitted disease prevention.  The patient was also informed of antibiotics decreasing contraceptive efficacy and the need for barrier contraception from the onset of her antibiotic dosing and through a minimum of thirty days from antibiotic cessation.    The life threatening side effect profile was reviewed in detail this includes but is not limited to shortness of breath, chest pain, severe or persistent headaches, or calf pain.  If any of these occur the patient has been instructed to stop using her hormonal based contraception, notify the office, and go to the emergency department or call 911.    The patient denied any personal history of blood clots in her leg, lung, or heart and denied any family history of stroke, TIA, sudden cardiac death < 40 y.o.,pulmonary embolism, or deep venous thrombosis.  The patient was informed of the recommended preventative health recommendations.    1. Annuals every year; Cytology collections per prevailing guidelines.   2. Mammograms begin every year at 27 yo if no abnormalities are found and no family     History.  3. Bone density studies every 2-3 years. Begin at 27 yo. If no fracture history or osteoporosis family history.(significant).  4. Colonoscopy begin at 27 yo. Repeat every ten years if negative and no family history.  5. Calcium of 1200-1500 mg/day in split dosing  6. Vitamin D 400-800 IU/day  7. All other preventative health recommendations will be managed by the patients Primary care physician.           PLAN:  Return in about 1 year (around 01/13/2015) for annual.    DEPO given.  Ultrasound for lump in left axillary.  HRT consent signed  Repeat Annual every 1 year  Cervical Cytology Evaluation begins at 27 years old.  If Negative Cytology, Follow-up screening per current guidelines.   Mammograms every 1 year. If 27 yo and last mammogram was negative.  Calcium and Vitamin D dosing reviewed.  Colonoscopy screening reviewed as well as onset for bone density testing.  Birth control and barrier recommendations discussed.  STD counseling and prevention reviewed.  Gardisil counseling completed for all patients 9-26 yo.  Routine health maintenance per patients PCP.  Orders Placed This Encounter   Procedures   ??? US Breast Left     Standing Status: Future      Number of Occurrences:       Standing Expiration Date: 01/13/2015     Order Specific Question:  Reason for exam:     Answer:  left breast lump   ??? GYN Cytology     Order Specific Question:  Collection Type?  Answer:  ( 2) Thin prep     Order Specific Question:  Diagnostic or Screening?     Answer:  ( 1) Screening     Order Specific Question:  HPV Requested?     Answer:  ( 5) HPV if ASCUS     Order Specific Question:  Present Status     Answer:  N/A     Order Specific Question:  Hysterectomy?     Answer:  N/A     Order Specific Question:  Hormone Replacement?     Answer:  N/A     Order Specific Question:  Birth Control     Answer:  ( 8) Depoprovera     Order Specific Question:  Present Illness     Answer:  (12) No present illness     Order Specific Question:  Specify Illness if other     Answer:  n/a     Order Specific Question:  HPV Status?     Answer:  N/A     Order Specific Question:  Clinical History     Answer:  N/A     Order Specific Question:  Specify Clinical History if other     Answer:  n/a     Order Specific Question:  Last Menstrual Period     Answer:  12/31/13     Order Specific Question:  Last PAP Smear     Answer:  01/06/13     Order Specific Question:  Prior Abnormal Smear     Answer:  (13) No prior abnormal  smear     Order Specific Question:  If Prior Abnormal, Give Date     Answer:  n/a     Order Specific Question:  Prior Treatment     Answer:  N/A     Order Specific Question:  Specify Treatment if other     Answer:  n/a     Order Specific Question:  Specimen Source     Answer:  ( 1) Cervical     Order Specific Question:  Specify Source if other     Answer:  n/a   ??? POCT urine pregnancy   ??? PR INJECTION,THERAP/PROPH/DIAGNOST, IM OR SUBCUT   ??? Negative Screen for Clinical Depression, Follow-up not Required [phone]

## 2014-01-13 NOTE — Progress Notes (Signed)
Per Annice PihJackie depo provera 150mg  given L deltoid lot #: [id number], negative upt [phone] [id number]

## 2014-01-22 ENCOUNTER — Encounter: Primary: Internal Medicine

## 2014-01-22 ENCOUNTER — Inpatient Hospital Stay: Attending: Advanced Practice Midwife | Primary: Internal Medicine

## 2014-02-05 ENCOUNTER — Encounter: Primary: Internal Medicine

## 2014-02-05 ENCOUNTER — Inpatient Hospital Stay: Attending: Advanced Practice Midwife | Primary: Internal Medicine

## 2014-04-06 ENCOUNTER — Encounter: Primary: Internal Medicine

## 2014-04-08 ENCOUNTER — Encounter
Admit: 2014-04-08 | Discharge: 2014-04-08 | Payer: PRIVATE HEALTH INSURANCE | Attending: Family | Primary: Internal Medicine

## 2014-04-08 DIAGNOSIS — N926 Irregular menstruation, unspecified: Secondary | ICD-10-CM

## 2014-04-08 LAB — POCT URINE PREGNANCY: Preg Test, Ur: NEGATIVE

## 2014-04-08 MED ORDER — MEDROXYPROGESTERONE ACETATE 150 MG/ML IM SUSP
150 MG/ML | Freq: Once | INTRAMUSCULAR | Status: AC
Start: 2014-04-08 — End: 2014-04-08
  Administered 2014-04-08: 22:00:00 150 mg via INTRAMUSCULAR

## 2014-04-08 NOTE — Progress Notes (Signed)
Per julie pt given depo provera 150mg lt del lot# [id number] exp 10/2016 ucg negative

## 2014-07-01 ENCOUNTER — Encounter
Admit: 2014-07-01 | Discharge: 2014-07-01 | Payer: PRIVATE HEALTH INSURANCE | Attending: Family | Primary: Internal Medicine

## 2014-07-01 DIAGNOSIS — Z3009 Encounter for other general counseling and advice on contraception: Secondary | ICD-10-CM

## 2014-07-01 LAB — POCT URINE PREGNANCY: Preg Test, Ur: NEGATIVE

## 2014-07-01 MED ORDER — MEDROXYPROGESTERONE ACETATE 150 MG/ML IM SUSP
150 MG/ML | Freq: Once | INTRAMUSCULAR | Status: AC
Start: 2014-07-01 — End: 2014-07-01
  Administered 2014-07-01: 20:00:00 150 mg via INTRAMUSCULAR

## 2014-07-01 NOTE — Progress Notes (Signed)
Pt given depo provera 150 mg IM in R del per [place], Lot #[id number] exp 9/18(our stock). UPT neg, lot #[id number] exp 7/17.

## 2014-07-10 NOTE — Telephone Encounter (Signed)
Error

## 2014-09-21 ENCOUNTER — Encounter
Admit: 2014-09-21 | Discharge: 2014-09-21 | Payer: PRIVATE HEALTH INSURANCE | Attending: Advanced Practice Midwife | Primary: Internal Medicine

## 2014-09-21 DIAGNOSIS — Z3009 Encounter for other general counseling and advice on contraception: Secondary | ICD-10-CM

## 2014-09-21 LAB — POCT URINE PREGNANCY: Preg Test, Ur: NEGATIVE

## 2014-09-21 MED ORDER — MEDROXYPROGESTERONE ACETATE 150 MG/ML IM SUSP
150 MG/ML | Freq: Once | INTRAMUSCULAR | Status: AC
Start: 2014-09-21 — End: 2014-09-21
  Administered 2014-09-21: 20:00:00 150 mg via INTRAMUSCULAR

## 2014-09-21 NOTE — Progress Notes (Signed)
Pt given depo provera 150 mg IM in L del per [place], Lot #[id number] exp 1/19(our stock). UPT neg, lot #[id number] exp 11/17.

## 2014-11-03 NOTE — Telephone Encounter (Signed)
Pt given recommendations. Pt advised to go to the ER if heavy bleeding occurs. Pt would like to talk about switching birth controls when she comes back in.

## 2014-11-03 NOTE — Telephone Encounter (Signed)
Patient is on depo for over a year and has been bleeding for last 3 weeks. Wonders what she can do to stop it

## 2014-11-03 NOTE — Telephone Encounter (Signed)
Please review

## 2014-11-03 NOTE — Telephone Encounter (Signed)
May have irregular menses with depo or prolonged periods.  Nothing she can do to stop it but if she is symptomatic due to blood loss, have patient go to ER.  If patient continues to have prolonged menses, irregular periods, patient may switch to different birth control method when her next injection is due.

## 2014-11-18 ENCOUNTER — Encounter: Admit: 2014-11-18 | Discharge: 2015-01-21 | Payer: PRIVATE HEALTH INSURANCE | Primary: Internal Medicine

## 2014-11-18 ENCOUNTER — Inpatient Hospital Stay
Admit: 2014-11-18 | Discharge: 2014-11-18 | Disposition: A | Discharge status: Home / Self Care | Attending: Emergency Medicine

## 2014-11-18 DIAGNOSIS — S134XXA Sprain of ligaments of cervical spine, initial encounter: Secondary | ICD-10-CM

## 2014-11-18 MED ORDER — ONDANSETRON HCL 4 MG PO TABS
4 MG | Freq: Once | ORAL | Status: AC
Start: 2014-11-18 — End: 2014-11-18
  Administered 2014-11-18: 17:00:00 4 mg via ORAL

## 2014-11-18 MED ORDER — ACETAMINOPHEN-CODEINE 300-30 MG PO TABS
300-30 MG | ORAL_TABLET | Freq: Three times a day (TID) | ORAL | 0 refills | Status: DC | PRN
Start: 2014-11-18 — End: 2015-03-08

## 2014-11-18 MED ORDER — ONDANSETRON 4 MG PO TBDP
4 MG | ORAL_TABLET | Freq: Four times a day (QID) | ORAL | 1 refills | Status: AC | PRN
Start: 2014-11-18 — End: 2014-11-21

## 2014-11-18 MED ORDER — CYCLOBENZAPRINE HCL 10 MG PO TABS
10 MG | ORAL_TABLET | Freq: Three times a day (TID) | ORAL | 0 refills | Status: AC | PRN
Start: 2014-11-18 — End: 2014-11-21

## 2014-11-18 MED FILL — ONDANSETRON HCL 4 MG PO TABS: 4 MG | ORAL | Qty: 1

## 2014-11-18 NOTE — Other (Addendum)
Patient Acct Nbr:  [id number]  Primary AUTH/CERT:    Primary Insurance Company Name:   [facility] [employer]  Primary Insurance Plan Name:  [id number]  Primary Insurance Group Number:    Primary Insurance Plan Type:   Primary Insurance Policy Number:

## 2014-11-18 NOTE — Discharge Instructions (Signed)
Whiplash: Care Instructions  Your Care Instructions  Whiplash occurs when your head is suddenly forced forward and then snapped backward, as might happen in a car accident or sports injury. This can cause pain and stiffness in your neck. Your head, chest, shoulders, and arms also may hurt.  Most whiplash gets better with home care. Your doctor may advise you to take medicine to relieve pain or relax your muscles. He or she may suggest exercise and physical therapy to increase flexibility and relieve pain. You can try wearing a neck (cervical) collar to support your neck. For a while you probably will need to avoid lifting and other activities that can strain the neck.  Follow-up care is a key part of your treatment and safety. Be sure to make and go to all appointments, and call your doctor if you are having problems. It's also a good idea to know your test results and keep a list of the medicines you take.  How can you care for yourself at home?   Take pain medicines exactly as directed.   If the doctor gave you a prescription medicine for pain, take it as prescribed.   If you are not taking a prescription pain medicine, ask your doctor if you can take an over-the-counter medicine.   Do not take two or more pain medicines at the same time unless the doctor told you to. Many pain medicines have acetaminophen, which is Tylenol. Too much acetaminophen (Tylenol) can be harmful.   You can try using a soft foam collar to support your neck for short periods of time. You can buy one at most drugstores. Do not wear the collar more than 2 or 3 days unless your doctor tells you to.   You can try using heat and ice to see if it helps.   Try using a heating pad on a low or medium setting for 15 to 20 minutes every 2 to 3 hours. Try a warm shower in place of one session with the heating pad. You can also buy single-use heat wraps that last up to 8 hours.   You can also try an ice pack for 10 to 15 minutes every 2 to 3  hours.   Do not do anything that makes the pain worse. Take it easy for a couple of days. You can do your usual activities if they do not hurt your neck or put it at risk for more stress or injury. Avoid lifting, sports, or other activities that might strain your neck.   Try sleeping on a special neck pillow. Place it under your neck, not under your head. Placing a tightly rolled-up towel under your neck while you sleep will also work. If you use a neck pillow or rolled towel, do not use your regular pillow at the same time.   Once your neck pain is gone, do exercises to stretch your neck and back and make them stronger. Your doctor or physical therapist can tell you which exercises are best.  When should you call for help?  Call 911 anytime you think you may need emergency care. For example, call if:   You are unable to move an arm or a leg at all.  Call your doctor now or seek immediate medical care if:   You have new or worse symptoms in your arms, legs, chest, belly, or buttocks. Symptoms may include:   Numbness or tingling.   Weakness.   Pain.   You lose bladder or   bowel control.  Watch closely for changes in your health, and be sure to contact your doctor if:   You are not getting better as expected.   Where can you learn more?   Go to [email or web address] and sign in to your MyChart account. Enter [id number] in the Search Health Information box to learn more about "Whiplash: Care Instructions."    If you do not have an account, please click on the "Sign Up Now" link.    2006-2016 Healthwise, Incorporated. Care instructions adapted under license by [facility]. This care instruction is for use with your licensed healthcare professional. If you have questions about a medical condition or this instruction, always ask your healthcare professional. Healthwise, Incorporated disclaims any warranty or liability for your use of this information.  Content Version: 10.8.513193; Current as of:  Aug 22, 2013          Headache: Care Instructions  Your Care Instructions     Headaches have many possible causes. Most headaches aren't a sign of a more serious problem, and they will get better on their own. Home treatment may help you feel better faster.  The doctor has checked you carefully, but problems can develop later. If you notice any problems or new symptoms, get medical treatment right away.  Follow-up care is a key part of your treatment and safety. Be sure to make and go to all appointments, and call your doctor if you are having problems. It's also a good idea to know your test results and keep a list of the medicines you take.  How can you care for yourself at home?   Do not drive if you have taken a prescription pain medicine.   Rest in a quiet, dark room until your headache is gone. Close your eyes and try to relax or go to sleep. Don't watch TV or read.   Put a cold, moist cloth or cold pack on the painful area for 10 to 20 minutes at a time. Put a thin cloth between the cold pack and your skin.   Use a warm, moist towel or a heating pad set on low to relax tight shoulder and neck muscles.   Have someone gently massage your neck and shoulders.   Take pain medicines exactly as directed.   If the doctor gave you a prescription medicine for pain, take it as prescribed.   If you are not taking a prescription pain medicine, ask your doctor if you can take an over-the-counter medicine.   Be careful not to take pain medicine more often than the instructions allow, because you may get worse or more frequent headaches when the medicine wears off.   Do not ignore new symptoms that occur with a headache, such as a fever, weakness or numbness, vision changes, or confusion. These may be signs of a more serious problem.  To prevent headaches   Keep a headache diary so you can figure out what triggers your headaches. Avoiding triggers may help you prevent headaches. Record when each headache began,  how long it lasted, and what the pain was like (throbbing, aching, stabbing, or dull). Write down any other symptoms you had with the headache, such as nausea, flashing lights or dark spots, or sensitivity to bright light or loud noise. Note if the headache occurred near your period. List anything that might have triggered the headache, such as certain foods (chocolate, cheese, wine) or odors, smoke, bright light, stress, or lack of sleep.   Find healthy  ways to deal with stress. Headaches are most common during or right after stressful times. Take time to relax before and after you do something that has caused a headache in the past.   Try to keep your muscles relaxed by keeping good posture. Check your jaw, face, neck, and shoulder muscles for tension, and try relaxing them. When sitting at a desk, change positions often, and stretch for 30 seconds each hour.   Get plenty of sleep and exercise.   Eat regularly and well. Long periods without food can trigger a headache.   Treat yourself to a massage. Some people find that regular massages are very helpful in relieving tension.   Limit caffeine by not drinking too much coffee, tea, or soda. But don't quit caffeine suddenly, because that can also give you headaches.   Reduce eyestrain from computers by blinking frequently and looking away from the computer screen every so often. Make sure you have proper eyewear and that your monitor is set up properly, about an arm's length away.   Seek help if you have depression or anxiety. Your headaches may be linked to these conditions. Treatment can both prevent headaches and help with symptoms of anxiety or depression.  When should you call for help?  Call 911 anytime you think you may need emergency care. For example, call if:   You have signs of a stroke. These may include:   Sudden numbness, paralysis, or weakness in your face, arm, or leg, especially on only one side of your body.   Sudden vision  changes.   Sudden trouble speaking.   Sudden confusion or trouble understanding simple statements.   Sudden problems with walking or balance.   A sudden, severe headache that is different from past headaches.  Call your doctor now or seek immediate medical care if:   You have a new or worse headache.   Your headache gets much worse.   Where can you learn more?   Go to [email or web address] and sign in to your MyChart account. Enter M271 in the Search Health Information box to learn more about "Headache: Care Instructions."    If you do not have an account, please click on the "Sign Up Now" link.    2006-2016 Healthwise, Incorporated. Care instructions adapted under license by [facility]. This care instruction is for use with your licensed healthcare professional. If you have questions about a medical condition or this instruction, always ask your healthcare professional. Healthwise, Incorporated disclaims any warranty or liability for your use of this information.  Content Version: 10.8.513193; Current as of: May 23, 2013      If you have insurance please contact your insurance carrier for their In-Network Preferred Providers in our area that are accepting new patients.  Otherwise, list provided below that you can call for clinics that are seeing new patients.      [facility]     [phone]  ** please call for Physician availability      [facility]   Clinic List:    [facility]  Specialty Clinics   [phone]   Burn/Plastic, ENT, GI, Orthopedics, (Orthopedics D.O.), Surgical, TRAUMA, Urology, Vascular   Call for appointment    ----------------------------------------------------------    [facility]  [address]  [phone]   Pediatric Primary Care/ Adult Primary Care / OB/Prenatal/GYN/Specialty Clinics   Mon - Fri  8a - 4:30p  -----------------------------------------------------------  [facility]  [address]  [phone]   Assistance with applying for chronic long term meds (high BP, Diabetes, etc), offered thru programs made available by various pharmaceutical companies   Mon - Fri           Call to make appointment    -----------------------------------------------------------  [facility]  [address]  [phone]   Pediatrics and [facility] - Fri      9a - 7p    ------------------------------------------------------------  [facility]  [address]  [phone]    Adult Medicine, Pediatrics, OB/GYN      Mon - Fri   8:30a - 5p    -----------------------------------------------------------  [facility]  [address], [place]  [phone]    Wed AM each week    -------------------------------------------------------------    [facility]  [address], [place]  [address]     Adult Internal Medicine      [place], Fri    8a - 4p       [phone]         Wed - 1p - 4P              OB/GYN Clinic  [phone] [place], Thurs, Fri    10a - 4p  Wed - 1p - 4p  (closed from 12p noon - 1p    Pediatrics Clinic  [phone] [place], Fri    8:30 a - 4:15p  Wed - 12:30p - 4:15p    Podiatry Clinic  [phone]  Mon, Wed, Fri     1:00p to 5:00p    Health Department  [facility]  [address]   [phone]   Pediatric Primary Care Mon - Wed & Fri  Adult Primary Care  Mon - Fri 8a - 12p noon  OB/Prenatal   Thurs - 8a - 4:45p      [facility]  [address] - Fri 8:30a - 5p   OB/GYN [phone]   Adult Internal Med [phone]  Pediatrics [phone]  Neuro/Headache [phone]    [facility]  [phone]   [facility] - Fri     9a - 5p    [facility]  [address]  [phone]   Adult Medicine, Eye Clinic, Dental  Patient must be certified homeless   Days and hours vary    Call for  appointment     [facility]  [address]   Family Practice  [phone]     OB [phone]   [place], [place], Wed, Fri   9a - 5p  Thurs - 9a - 6p     Wed (OB only)    [facility]  [address]  [phone]   [facility]   [place], Tues, [place], Fri  9a - 5p    (closed 12p -1p)  Wed - 1p - 5    -----------------------------------------------------------    [facility]   [phone]   Various Clinics 8a - 5:30    [facility]  [address]  [place], [place]  [address]  [phone]    OB/Prenatal     Tues - [phone] - 4:45p  [facility] - Wed & Fri   8a - 4:45p    [facility]  [address]  [phone]    Family Practice Mon - Fri    8a - 4:30p    [facility]  Psychiatric  [address], [id number]  [phone] Mon - Fri 8a - 4:30p    [id number] [place], [place]  [address]  [phone]    Mon - Fri  8a-4:30p,    Thurs - 8a - 8p      OutPatient Clinics    Asthma Management Clinic  [facility]  [address]    [phone]    Mon [place]    9a - 5P    [facility]  [phone]    Call for appointment    Heart Failure Clinic - [facility]  [address]  [phone]  Mon - Fri  8:30a - 4p        Miscellaneous Information    [employer] First Call for Help      [facility] Eligibility [facility])  [phone]    [phone] or toll-free [phone]         Dental Services  [facility]  [address]  [phone]      [address] [phone]     * Pt must have source of income & must     * Pt must be homeless; call for eligibility guidelines        bring 2 recent check stubs to appt                               * Under age 71 not accepted     * By appointment only       * Doors open at 8:30a - day of week varies    [facility] [employer] [facility]  [phone]     * By appt only - Mon - Fri - 8a - 5p     * One Evening/week  * $25.00 for initial consultation, exam, cleaning    * Does not accept Medicaid
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
## 2014-12-01 ENCOUNTER — Ambulatory Visit
Admit: 2014-12-01 | Discharge: 2014-12-01 | Payer: PRIVATE HEALTH INSURANCE | Attending: Internal Medicine | Primary: Internal Medicine

## 2014-12-01 DIAGNOSIS — IMO0002 Reserved for concepts with insufficient information to code with codable children: Secondary | ICD-10-CM

## 2014-12-01 MED ORDER — IBUPROFEN 400 MG PO TABS
400 MG | ORAL_TABLET | Freq: Four times a day (QID) | ORAL | 3 refills | Status: AC | PRN
Start: 2014-12-01 — End: ?

## 2014-12-01 MED ORDER — AMITRIPTYLINE HCL 25 MG PO TABS
25 MG | ORAL_TABLET | Freq: Every evening | ORAL | 3 refills | Status: DC
Start: 2014-12-01 — End: 2015-03-08
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
## 2014-12-14 ENCOUNTER — Encounter
Admit: 2014-12-14 | Discharge: 2014-12-14 | Payer: PRIVATE HEALTH INSURANCE | Attending: Family | Primary: Internal Medicine

## 2014-12-14 DIAGNOSIS — Z3009 Encounter for other general counseling and advice on contraception: Secondary | ICD-10-CM

## 2014-12-14 LAB — POCT URINE PREGNANCY: Preg Test, Ur: NEGATIVE

## 2014-12-14 MED ORDER — MEDROXYPROGESTERONE ACETATE 150 MG/ML IM SUSP
150 MG/ML | Freq: Once | INTRAMUSCULAR | Status: AC
Start: 2014-12-14 — End: 2014-12-14
  Administered 2014-12-14: 20:00:00 150 mg via INTRAMUSCULAR

## 2014-12-14 NOTE — Progress Notes (Signed)
Per Raynelle Fanning pt given Depo Provera in right del. Lot number [id number] exp 3/18. Negative upt [id number] 03/18

## 2015-03-03 ENCOUNTER — Encounter: Attending: Internal Medicine | Primary: Internal Medicine

## 2015-03-08 ENCOUNTER — Ambulatory Visit
Admit: 2015-03-08 | Discharge: 2015-03-08 | Payer: PRIVATE HEALTH INSURANCE | Attending: Advanced Practice Midwife | Primary: Internal Medicine

## 2015-03-08 DIAGNOSIS — Z3009 Encounter for other general counseling and advice on contraception: Secondary | ICD-10-CM

## 2015-03-08 LAB — POCT URINE PREGNANCY: Preg Test, Ur: NEGATIVE

## 2015-03-08 MED ORDER — MEDROXYPROGESTERONE ACETATE 150 MG/ML IM SUSP
150 MG/ML | Freq: Once | INTRAMUSCULAR | Status: AC
Start: 2015-03-08 — End: 2015-03-08
  Administered 2015-03-08: 17:00:00 150 mg via INTRAMUSCULAR

## 2015-03-08 NOTE — Progress Notes (Signed)
History and Physical  [facility]  [facility] [address], [address]  [place], [place]  [address] [phone]   Fax [phone]  ADDILYNNE [name]  03/08/2015              29 y.o.  Chief Complaint   Patient presents with   ??? Annual Exam   ??? Injections       No LMP recorded.             Primary Care Physician: Wendie Simmer, MD    The patient was seen and examined. She has no chief complaint today and is here for her annual exam.  Her bowels are regular. There are no voiding complaints. She denies any bloating.  She denies vaginal discharge and was counseled on STD's and the need for barrier contraception.     HPI : Brittney [name] is a 29 y.o. female [phone]    Gyn exam C/O vaginal odor  ________________________________________________________________________  Obstetric History    G7   P7   T2   P5   A0   TAB0   SAB0   E0   M0   L6       # Outcome Date GA Lbr Len/2nd Weight Sex Delivery Anes PTL Lv   7 Preterm 07/15/13 [redacted]w[redacted]d  5 lb 7 oz (2.466 kg) F CS-LTranv Spinal N Y      Complications: Oligohydramnios,Circumvallate placenta,IUGR (intrauterine growth restriction),Arrest of dilation, delivered, current hospitalization      Apgar1:  8                Apgar5: 9   6 Term 03/22/11 [redacted]w[redacted]d  5 lb 5 oz (2.41 kg) F Vag-Spont  N Y      Name: Brittney [name]   5 Preterm 03/30/10 [redacted]w[redacted]d  7.9 oz (0.225 kg) [name] Mech  Y ND   4 Term 06/22/09 [redacted]w[redacted]d  6 lb 8 oz (2.948 kg) F Vag-Spont   Y   3 Preterm 08/28/07 [redacted]w[redacted]d  4 lb 1 oz (1.843 kg) F Vag-Spont  Y Y   2 Preterm 07/21/06 [redacted]w[redacted]d  6 lb 5 oz (2.863 kg) M Vag-Spont  Y Y   1 Preterm 12/02/03 [redacted]w[redacted]d  4 lb 5 oz (1.956 kg) M Vag-Spont   Y        Past Medical History   Diagnosis Date   ??? Depression      BIPOLAR, SEVERE DEPRESSION ANXIETY   ??? History of gonorrhea 05/30/12     tx'd Rocephin 250 mg IM 05/30/12   ??? History of low transverse cesarean section 07/15/13   ??? History of trichomoniasis 03/29/10   ??? Left fetal pyelectasis, antepartum 02/14/2011   ??? Mild preeclampsia delivered  05,     ELEVATED BP IN EVERY PREGNANCY   ??? Mild preeclampsia delivered    ??? 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 Cesarean section, low transverse  07/15/13     Family History   Problem Relation Age of Onset   ??? Seizures Mother  HEAD TRAMA   ??? Depression Mother    ??? Breast Cancer Neg Hx    ??? Cancer Neg Hx    ??? Colon Cancer Neg Hx    ??? Diabetes Neg Hx    ??? Eclampsia Neg Hx    ??? Hypertension Neg Hx    ??? Ovarian Cancer Neg Hx    ??? Preterm Labor Neg Hx    ??? Spont Abortions Neg Hx    ??? Stroke Neg Hx    ??? Cervical Cancer Neg Hx    ??? Lupus Neg Hx      Social History     Social History   ??? Marital status: Single     Spouse name: N/A   ??? Number of children: N/A   ??? Years of education: N/A     Occupational History   ??? Not on file.     Social History Main Topics   ??? Smoking status: Current Every Day Smoker     Packs/day: 0.50     Years: 7.00   ??? Smokeless tobacco: Never Used      Comment: "<1/2pk/day"   ??? Alcohol use No   ??? Drug use: No   ??? Sexual activity: Yes     Partners: Male     Other Topics Concern   ??? Not on file     Social History Narrative       MEDICATIONS:  Current Outpatient Prescriptions   Medication Sig Dispense Refill   ??? OLANZapine (ZYPREXA) 15 MG tablet take 1 tablet by mouth once daily  0   ??? amitriptyline (ELAVIL) 100 MG tablet take 1 tablet by mouth at bedtime  0   ??? vitamin D (ERGOCALCIFEROL) 50000 UNITS CAPS capsule   0   ??? ibuprofen (ADVIL;MOTRIN) 400 MG tablet Take 1 tablet by mouth every 6 hours as needed for Pain 120 tablet 3     Current Facility-Administered Medications   Medication Dose Route Frequency Provider Last Rate Last Dose   ??? medroxyPROGESTERone (DEPO-PROVERA) injection 150 mg  150 mg Intramuscular Once Melody ComasJackie L Erion Weightman, CNM               ALLERGIES:  Allergies as of 03/08/2015 - Review Complete 03/08/2015   Allergen Reaction Noted   ??? Other Hives 02/03/2013        Symptoms of decreased mood absent    **If either question is answered in a  positive fashion then complete the PHQ9 Scoring Evaluation and make the appropriate referral**      Immunization status: up to date and documented, stated as current, but no records available.      Gynecologic History:  Menarche: [age] yo  Menopause at  yo     No LMP recorded.    Sexually Active: Yes    STD History: No     Permanent Sterilization: No   Reversible Birth Control: Yes depo provera        Hormone Replacement Exposure: No      Genetic Qualified Family History of Breast, Ovarian , Colon or Uterine Cancer: See family hx     If YES see scanned worksheet.    Preventative Health Testing:  Date of Last Pap Smear: 01/2014 negative  Abnormal Pap Smear History: n/a  Colposcopy History:   Date of Last Mammogram: n/a  Date of Last Colonoscopy:   Date of Last Bone Density:      ________________________________________________________________________  REVIEW OF SYSTEMS:    yes   A minimum of an  eleven point review of systems was completed.    Review Of Systems (11 point):  Constitutional: No fever, chills or malaise; No weight change or fatigue  Head and Eyes: No vision, Headache, Dizziness or trauma in last 12 months  ENT ROS: No hearing, Tinnitis, sinus or taste problems  Hematological and Lymphatic ROS:No Lymphoma, Von Willebrand's, Hemophillia or Bleeding History  Psych ROS: No Depression, Homicidal thoughts,suicidal thoughts, or anxiety  Breast ROS: No prior breast abnormalities or lumps  Respiratory ROS: No SOB, Pneumoniae,Cough, or Pulmonary Embolism History  Cardiovascular ROS: No Chest Pain with Exertion, Palpitations, Syncope, Edema, Arrhythmia  Gastrointestinal ROS: No Indigestion, Heartburn, Nausea, vomiting, Diarrhea, Constipation,or Bowel Changes; No Bloody Stools or melena  Genito-Urinary ROS: No Dysuria, Hematuria or Nocturia. No Urinary Incontinence or Vaginal Discharge  Musculoskeletal ROS: No Arthralgia,  Arthritis,Gout,Osteoporosis or Rheumatism  Neurological ROS: No CVA, Migraines, Epilepsy, Seizure Hx, or Limb Weakness  Dermatological ROS: No Rash, Itching, Hives, Mole Changes or Cancer                                                                                                                                                                                                                                  PHYSICAL Exam:     Constitutional:  Vitals:    03/08/15 1128   BP: 114/62   Pulse: 80   Resp: 18   Weight: 139 lb (63 kg)   Height:  (1.626 m)       General Appearance:  This  is a well Developed, well Nourished, well groomed female.      Her BMI was reviewed. Nutritional decision making was discussed.    Skin:  There was a Normal Inspection of the skin without rashes or lesions.  There were no rashes.  (Papular, Maculopapular, Hives, Pustular, Macular)     There were no lesions (Ulcers, Erythema, Abn. Appearing Nevi)            Lymphatic:  No Lymph Nodes were Palpable in the neck , axilla or groin.   # Of Lymph Nodes; Location ; Character [Normal]  [Shotty] [Tender] [Enlarged]     Neck and EENT:  The neck was supple. There were no masses   The thyroid was not enlarged and had no masses.  Perrla, EOMI B/L, TMI B/L No Abnormalities.   Throat inspected-No exudates or Masses, Nares Patent No Masses        Respiratory:  The lungs were auscultated and found to be clear. There were no rales, rhonchi or wheezes. There was a good respiratory effort.    Cardiovascular:  The heart was in a regular rate and rhythm. . No S3 or S4. There was no murmur appreciated. Location, grade, and radiation are not applicable.     Extremities:  The patients extremities were without calf tenderness, edema, or varicosities.  There was full range of motion in all four extremities. Pulses in all four extremities were appreciated and are 2/4.    Abdomen:  The abdomen was soft and non-tender. There were good bowel sounds in all quadrants  and there was no guarding, rebound or rigidity.  On evaluation there was no evidence of hepatosplenomegaly and there was no costal vertebral angel tenderness bilaterally.  No hernias were appreciated.     Abdominal Scars:     Psych:  The patient had a normal Orientation to: Time, Place, Person, and Situation  There is no Mood / Affect changes    Breast:  (Chest)  normal appearance, no masses or tenderness, Inspection negative  Self breast exams were reviewed in detail. Literature was given.    Pelvic Exam:  Vulva and vagina appear normal. Bimanual exam reveals normal uterus and adnexa. Discharge noted    Rectal Exam:  exam declined by patient          Musculosk:  Normal Gait and station was noted.  Digits were evaluated without abnormal findings.  Range of motion, stability and strength were evaluated and found to be appropriate for the patients age.        OMM Structural Component:  The patient did not complain of a Chief complaint requiring OMM.  Chief Complaint:none    Structural Exam: No Interest                  ASSESSMENT:      29 y.o. Annual  1. Family planning  POCT urine pregnancy    medroxyPROGESTERone (DEPO-PROVERA) injection 150 mg   2. Negative pregnancy test  POCT urine pregnancy    medroxyPROGESTERone (DEPO-PROVERA) injection 150 mg   3. Vaginal odor  VAGINITIS DNA PROBE    Chlamydia Trachomatis & Neisseria gonorrhoeae (GC) by amplified detection          Chief Complaint   Patient presents with   ??? Annual Exam   ??? Injections          Past Medical History   Diagnosis Date   ??? Depression      BIPOLAR, SEVERE DEPRESSION ANXIETY   ??? History of gonorrhea 05/30/12     tx'd Rocephin 250 mg IM 05/30/12   ??? History of low transverse cesarean section 07/15/13   ??? History of trichomoniasis 03/29/10   ??? Left fetal pyelectasis, antepartum 02/14/2011   ??? Mild preeclampsia delivered 05,     ELEVATED BP IN EVERY PREGNANCY   ??? Mild preeclampsia delivered    ??? 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 Grand multipara 01/06/2013     Priority: High   ??? Fetal demise, less than 22 weeks 01/06/2013     Priority: High   ??? Smokes 01/06/2013     Priority: High     Discussed smoking cessation     ??? History of gonorrhea      Priority: Medium   ??? History  of trichomoniasis      Priority: Medium   ??? Varicosities      Priority: Low     LEGS, WORSE WHEN PREGNANT     ??? Depression      Priority: Low     BIPOLAR, SEVERE DEPRESSION ANXIETY, Ordered to go to counseling but refuses admission, reports ? suicidal  Attempts in pass, refuses medication.     ??? PLTCS 07/15/13 F Apg 8,9 Wt. 5#7 07/15/2013          Hereditary Breast, Ovarian, Colon and Uterine Cancer screening Done.          Tobacco & Secondary smoke risks reviewed; instructed on cessation and avoidance      Counseling Completed:  Preventative Health Recommendations and Follow up.  The patient was informed of the recommended preventative health recommendations.    1. Annuals every year; Cytology collections per prevailing guidelines.   2. Mammograms begin every year at 28 yo if no abnormalities are found and no family     History.  3. Bone density studies every 2-3 years. Begin at 28 yo. If no fracture history or osteoporosis family history.(significant).  4. Colonoscopy begin at 28 yo. Repeat every ten years if negative and no family history.  5. Calcium of 1200-1500 mg/day in split dosing  6. Vitamin D 400-800 IU/day  7. All other preventative health recommendations will be managed by the patients Primary care physician.             PLAN:  Vaginal cultures collected  UPT negative  Depo provera given  Return in about 1 year (around 03/07/2016) for Annual.  Repeat Annual every 1 year  Cervical Cytology Evaluation begins at 28 years old.  If Negative Cytology, Follow-up screening per current guidelines.   Mammograms every 1 year. If 28 yo and last mammogram was negative.  Calcium and Vitamin D dosing reviewed.  Colonoscopy  screening reviewed as well as onset for bone density testing.  Birth control and barrier recommendations discussed.  STD counseling and prevention reviewed.  Gardisil counseling completed for all patients 9-26 yo.  Routine health maintenance per patients PCP.  Orders Placed This Encounter   Procedures   ??? VAGINITIS DNA PROBE   ??? Chlamydia Trachomatis & Neisseria gonorrhoeae (GC) by amplified detection   ??? POCT urine pregnancy

## 2015-03-08 NOTE — Progress Notes (Signed)
Per Annice PihJackie depo provera 150mg  given L deltoid, lot# [id number], negative upt [id number]

## 2015-03-09 LAB — VAGINAL PATHOGENS PROBE *A
Candida Species, DNA Probe: NEGATIVE
Gardnerella Vaginalis, DNA Probe: POSITIVE — AB
Trichomonas Vaginalis DNA: NEGATIVE

## 2015-03-09 LAB — CHLAMYDIA TRACHOMATIS, NAA
Chlamydia trachomatis, RNA: NEGATIVE
N. Gonorrhoeae Amplified: NEGATIVE

## 2015-03-10 MED ORDER — METRONIDAZOLE 500 MG PO TABS
500 MG | ORAL_TABLET | Freq: Two times a day (BID) | ORAL | 0 refills | Status: AC
Start: 2015-03-10 — End: 2015-03-17

## 2015-03-10 NOTE — Telephone Encounter (Signed)
Per Annice Pihjackie pt notified of culture results and flagyl 500mg  sent to pt pharm.

## 2015-03-22 MED ORDER — FLUCONAZOLE 150 MG PO TABS
150 MG | ORAL_TABLET | Freq: Once | ORAL | 0 refills | Status: AC
Start: 2015-03-22 — End: 2015-03-22

## 2015-03-22 NOTE — Telephone Encounter (Signed)
Pt requesting diflucan for yeast

## 2015-03-22 NOTE — Telephone Encounter (Signed)
[facility]

## 2015-04-22 ENCOUNTER — Ambulatory Visit
Admit: 2015-04-22 | Discharge: 2015-04-22 | Payer: PRIVATE HEALTH INSURANCE | Attending: Advanced Practice Midwife | Primary: Internal Medicine

## 2015-04-22 DIAGNOSIS — N898 Other specified noninflammatory disorders of vagina: Secondary | ICD-10-CM

## 2015-04-22 NOTE — Progress Notes (Signed)
Brittney [name]  04/22/2015    Date Of Birth:  07/02/1985          The patient was seen today. She is here regarding C/O of vaginal odor and hx of GC ( wants test of cure). Her bowels are regular and she is voiding without difficulty.     HPI:  Brittney [name] is a 30 y.o. female [phone] vaginitis      Obstetric History    G7   P7   T2   P5   A0   TAB0   SAB0   E0   M0   L6       # Outcome Date GA Lbr Len/2nd Weight Sex Delivery Anes PTL Lv   7 Preterm 07/15/13 [redacted]w[redacted]d  5 lb 7 oz (2.466 kg) F CS-LTranv Spinal N Y      Complications: Oligohydramnios,Circumvallate placenta,IUGR (intrauterine growth restriction),Arrest of dilation, delivered, current hospitalization      Apgar1:  8                Apgar5: 9   6 Term 03/22/11 [redacted]w[redacted]d  5 lb 5 oz (2.41 kg) F Vag-Spont  N Y      Name: [name]   5 Preterm 03/30/10 [redacted]w[redacted]d  7.9 oz (0.225 kg) [name] Mech  Y ND   4 Term 06/22/09 [redacted]w[redacted]d  6 lb 8 oz (2.948 kg) F Vag-Spont   Y   3 Preterm 08/28/07 [redacted]w[redacted]d  4 lb 1 oz (1.843 kg) F Vag-Spont  Y Y   2 Preterm 07/21/06 [redacted]w[redacted]d  6 lb 5 oz (2.863 kg) M Vag-Spont  Y Y   1 Preterm 12/02/03 105w0d  4 lb 5 oz (1.956 kg) M Vag-Spont   Y          Past Medical History   Diagnosis Date   ??? Depression      BIPOLAR, SEVERE DEPRESSION ANXIETY   ??? History of gonorrhea 05/30/12     tx'd Rocephin 250 mg IM 05/30/12   ??? History of low transverse cesarean section 07/15/13   ??? History of trichomoniasis 03/29/10   ??? Left fetal pyelectasis, antepartum 02/14/2011   ??? Mild preeclampsia delivered 05,     ELEVATED BP IN EVERY PREGNANCY   ??? Mild preeclampsia delivered    ??? 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 Cesarean section, low transverse  07/15/13       Family History   Problem Relation Age of Onset   ??? Seizures Mother      HEAD TRAMA   ??? Depression Mother    ??? Breast Cancer Neg Hx    ??? Cancer Neg Hx    ??? Colon Cancer Neg Hx    ??? Diabetes Neg Hx    ??? Eclampsia Neg Hx    ??? Hypertension Neg Hx    ???  Ovarian Cancer Neg Hx    ??? Preterm Labor Neg Hx    ??? Spont Abortions Neg Hx    ??? Stroke Neg Hx    ??? Cervical Cancer Neg Hx    ??? Lupus Neg Hx        Social History     Social History   ??? Marital status: Single     Spouse name: N/A   ??? Number of children: N/A   ??? Years of education: N/A     Occupational History   ???  Not on file.     Social History Main Topics   ??? Smoking status: Current Every Day Smoker     Packs/day: 0.50     Years: 7.00   ??? Smokeless tobacco: Never Used      Comment: "<1/2pk/day"   ??? Alcohol use No   ??? Drug use: No   ??? Sexual activity: Yes     Partners: Male     Other Topics Concern   ??? Not on file     Social History Narrative         MEDICATIONS:  Current Outpatient Prescriptions   Medication Sig Dispense Refill   ??? OLANZapine (ZYPREXA) 15 MG tablet take 1 tablet by mouth once daily  0   ??? amitriptyline (ELAVIL) 100 MG tablet take 1 tablet by mouth at bedtime  0   ??? vitamin D (ERGOCALCIFEROL) 50000 UNITS CAPS capsule   0   ??? ibuprofen (ADVIL;MOTRIN) 400 MG tablet Take 1 tablet by mouth every 6 hours as needed for Pain 120 tablet 3     No current facility-administered medications for this visit.              ALLERGIES:  Allergies as of 04/22/2015 - Review Complete 04/22/2015   Allergen Reaction Noted   ??? Other Hives 02/03/2013         REVIEW OF SYSTEMS:    yes   A minimum of an eleven point review of systems was completed.    Review Of Systems (11 point):  Constitutional: No fever, chills or malaise; No weight change or fatigue  Head and Eyes: No vision, Headache, Dizziness or trauma in last 12 months  ENT ROS: No hearing, Tinnitis, sinus or taste problems  Hematological and Lymphatic ROS:No Lymphoma, Von Willebrand's, Hemophillia or Bleeding History  Psych ROS: No Depression, Homicidal thoughts,suicidal thoughts, or anxiety  Breast ROS: No prior breast abnormalities or lumps  Respiratory ROS: No SOB, Pneumoniae,Cough, or Pulmonary Embolism History  Cardiovascular ROS: No Chest Pain with Exertion,  Palpitations, Syncope, Edema, Arrhythmia  Gastrointestinal ROS: No Indigestion, Heartburn, Nausea, vomiting, Diarrhea, Constipation,or Bowel Changes; No Bloody Stools or melena  Genito-Urinary ROS: No Dysuria, Hematuria or Nocturia. No Urinary Incontinence or Vaginal Discharge  Musculoskeletal ROS: No Arthralgia, Arthritis,Gout,Osteoporosis or Rheumatism  Neurological ROS: No CVA, Migraines, Epilepsy, Seizure Hx, or Limb Weakness  Dermatological ROS: No Rash, Itching, Hives, Mole Changes or Cancer          Blood pressure 114/60, pulse 70, resp. rate 18, height  (1.626 m), weight 132 lb (59.9 kg), not currently breastfeeding.      Abdomen: Soft non-tender; good bowel sounds. No guarding, rebound or rigidity. No CVA tenderness bilaterally.    Extremities: No calf tenderness, DTR 2/4, and No edema bilaterally    Pelvic: Vulva and vagina appear normal. Bimanual exam reveals normal uterus and adnexa. Slight discharge noted    Diagnostics:  No results found.    Lab Results:  Results for orders placed or performed in visit on 03/08/15   CHLAMYDIA TRACHOMATIS, NAA   Result Value Ref Range    Source VAGINA     Chlamydia trachomatis, RNA NEGATIVE NEGATIVE    N. Gonorrhoeae Amplified NEGATIVE NEGATIVE   Vaginal Pathogens Probes *A   Result Value Ref Range    CANDIDA SPECIES, DNA PROBE NEGATIVE NEGATIVE    GARDNERELLA VAGINALIS, DNA PROBE POSITIVE (A) NEGATIVE    Trichomonas Vaginalis DNA NEGATIVE NEGATIVE   POCT urine pregnancy   Result Value Ref  Range    Preg Test, Ur negative     Control done          Assessment:  1. Vaginal odor  VAGINITIS DNA PROBE    C. Trachomatis / N. Christ Kick, DNA Probe     Patient Active Problem List    Diagnosis Date Noted   ??? Grand multipara 01/06/2013     Priority: High   ??? Fetal demise, less than 22 weeks 01/06/2013     Priority: High   ??? Smokes 01/06/2013     Priority: High     Discussed smoking cessation     ??? History of gonorrhea      Priority: Medium   ??? History of trichomoniasis       Priority: Medium   ??? Varicosities      Priority: Low     LEGS, WORSE WHEN PREGNANT     ??? Depression      Priority: Low     BIPOLAR, SEVERE DEPRESSION ANXIETY, Ordered to go to counseling but refuses admission, reports ? suicidal  Attempts in pass, refuses medication.     ??? PLTCS 07/15/13 F Apg 8,9 Wt. 5#7 07/15/2013           PLAN:  Vaginal cultures collected call for results  Repeat Annual every 1 year  Cervical Cytology Evaluation begins at 29 years old.  If Negative Cytology, Follow-up screening per current guidelines.   Return to the office prn.  Counseled on preventative health maintenance follow-up.  No orders of the defined types were placed in this encounter.      Patient was seen with total face to face time of 15 minutes. More than 50% of this visit was counseling and education regarding The encounter diagnosis was Vaginal odor. and Vaginal Odor   as well as  counseling on preventative health maintenance follow-up.

## 2015-04-23 LAB — VAGINITIS DNA PROBE
Direct Exam: NEGATIVE
Direct Exam: NEGATIVE
Direct Exam: NEGATIVE

## 2015-04-23 LAB — C.TRACHOMATIS N.GONORRHOEAE DNA
C. trachomatis DNA: NEGATIVE
N. gonorrhoeae DNA: NEGATIVE

## 2015-05-31 ENCOUNTER — Encounter
Admit: 2015-05-31 | Discharge: 2015-05-31 | Payer: PRIVATE HEALTH INSURANCE | Attending: Family | Primary: Internal Medicine

## 2015-05-31 DIAGNOSIS — Z3009 Encounter for other general counseling and advice on contraception: Secondary | ICD-10-CM

## 2015-05-31 LAB — POCT URINE PREGNANCY: Preg Test, Ur: NEGATIVE

## 2015-05-31 MED ORDER — MEDROXYPROGESTERONE ACETATE 150 MG/ML IM SUSP
150 MG/ML | Freq: Once | INTRAMUSCULAR | Status: AC
Start: 2015-05-31 — End: 2015-05-31
  Administered 2015-05-31: 16:00:00 150 mg via INTRAMUSCULAR

## 2015-05-31 NOTE — Progress Notes (Signed)
Per julie pt given depo provera  lt del lot# [id number] exp 12/2017 ucg negative

## 2015-08-23 ENCOUNTER — Encounter
Admit: 2015-08-23 | Discharge: 2015-08-23 | Payer: PRIVATE HEALTH INSURANCE | Attending: Advanced Practice Midwife | Primary: Internal Medicine

## 2015-08-23 DIAGNOSIS — Z3009 Encounter for other general counseling and advice on contraception: Secondary | ICD-10-CM

## 2015-08-23 LAB — POCT URINE PREGNANCY: Control: NEGATIVE

## 2015-08-23 MED ORDER — MEDROXYPROGESTERONE ACETATE 150 MG/ML IM SUSP
150 MG/ML | Freq: Once | INTRAMUSCULAR | Status: AC
Start: 2015-08-23 — End: 2015-08-23
  Administered 2015-08-23: 16:00:00 150 mg via INTRAMUSCULAR

## 2015-08-23 NOTE — Progress Notes (Signed)
Per Annice Pihjackie pt given depo provera 150mg  lt del lot# [id number] exp 03/2018 ucg negative

## 2015-11-15 ENCOUNTER — Encounter
Admit: 2015-11-15 | Discharge: 2015-11-15 | Payer: PRIVATE HEALTH INSURANCE | Attending: Family | Primary: Internal Medicine

## 2015-11-15 DIAGNOSIS — Z3042 Encounter for surveillance of injectable contraceptive: Secondary | ICD-10-CM

## 2015-11-15 LAB — POCT URINE PREGNANCY: Preg Test, Ur: NEGATIVE

## 2015-11-15 MED ORDER — MEDROXYPROGESTERONE ACETATE 150 MG/ML IM SUSP
150 MG/ML | Freq: Once | INTRAMUSCULAR | Status: AC
Start: 2015-11-15 — End: 2015-11-15
  Administered 2015-11-15: 16:00:00 150 mg via INTRAMUSCULAR

## 2015-11-15 NOTE — Progress Notes (Signed)
Per Raynelle FanningJulie, Pt given Depo Provera 150mg  Rt Deltoid Lot # [id number] Exp 1/20 Neg UPT Lot # [id number] Exp: 1/19

## 2016-02-03 NOTE — Telephone Encounter (Signed)
Records Release for Transfer to [place]

## 2016-02-07 ENCOUNTER — Encounter: Primary: Internal Medicine

## 2018-10-14 LAB — OB RESULTS CONSOLE PLATELET COUNT: Platelets: 305

## 2018-10-14 LAB — OB RESULTS CONSOLE HGB/HCT, BLOOD
HCT: 41 (ref 29–41)
Hemoglobin: 14

## 2018-10-14 LAB — DRUG SCREEN, URINE
Drug Screen, Urine: NEGATIVE
Hemoglobin A1C: 4.8

## 2018-10-14 LAB — OB RESULTS CONSOLE RUBELLA ANTIBODY, IGM: Rubella: IMMUNE

## 2018-10-14 LAB — OB RESULTS CONSOLE HIV ANTIBODY (ROUTINE TESTING): HIV: NONREACTIVE

## 2018-10-14 LAB — OB RESULTS CONSOLE RPR: RPR: NONREACTIVE

## 2018-10-14 LAB — OB RESULTS CONSOLE HEPATITIS B SURFACE ANTIGEN: Hepatitis B Surface Ag: NEGATIVE

## 2018-10-14 LAB — OB RESULTS CONSOLE ABO/RH: RH Type: POSITIVE

## 2018-10-14 LAB — OB RESULTS CONSOLE ANTIBODY SCREEN: Antibody Screen: NEGATIVE

## 2018-12-25 LAB — URINE CULTURE: Urine Culture, OB: NEGATIVE

## 2018-12-26 ENCOUNTER — Other Ambulatory Visit (HOSPITAL_COMMUNITY): Payer: Self-pay | Admitting: Specialist

## 2018-12-26 DIAGNOSIS — O285 Abnormal chromosomal and genetic finding on antenatal screening of mother: Secondary | ICD-10-CM

## 2018-12-26 DIAGNOSIS — Z3A19 19 weeks gestation of pregnancy: Secondary | ICD-10-CM

## 2018-12-26 DIAGNOSIS — Z3689 Encounter for other specified antenatal screening: Secondary | ICD-10-CM

## 2018-12-30 ENCOUNTER — Encounter (HOSPITAL_COMMUNITY): Payer: Self-pay | Admitting: *Deleted

## 2019-01-02 ENCOUNTER — Other Ambulatory Visit: Payer: Self-pay

## 2019-01-02 ENCOUNTER — Ambulatory Visit (HOSPITAL_COMMUNITY): Payer: Medicaid Other

## 2019-01-02 ENCOUNTER — Other Ambulatory Visit (HOSPITAL_COMMUNITY): Payer: Self-pay | Admitting: Specialist

## 2019-01-02 ENCOUNTER — Other Ambulatory Visit (HOSPITAL_COMMUNITY): Payer: Self-pay | Admitting: *Deleted

## 2019-01-02 ENCOUNTER — Encounter (HOSPITAL_COMMUNITY): Payer: Self-pay

## 2019-01-02 ENCOUNTER — Ambulatory Visit (HOSPITAL_COMMUNITY): Payer: Medicaid Other | Admitting: *Deleted

## 2019-01-02 ENCOUNTER — Ambulatory Visit (HOSPITAL_COMMUNITY)
Admission: RE | Admit: 2019-01-02 | Discharge: 2019-01-02 | Disposition: A | Discharge status: Home / Self Care | Payer: Medicaid Other | Source: Ambulatory Visit | Attending: Obstetrics and Gynecology | Admitting: Obstetrics and Gynecology

## 2019-01-02 VITALS — BP 122/78 | HR 117 | Temp 98.7°F | Ht 65.0 in | Wt 137.2 lb

## 2019-01-02 DIAGNOSIS — Z3A19 19 weeks gestation of pregnancy: Secondary | ICD-10-CM

## 2019-01-02 DIAGNOSIS — Z362 Encounter for other antenatal screening follow-up: Secondary | ICD-10-CM

## 2019-01-02 DIAGNOSIS — R772 Abnormality of alphafetoprotein: Secondary | ICD-10-CM

## 2019-01-02 DIAGNOSIS — O0990 Supervision of high risk pregnancy, unspecified, unspecified trimester: Secondary | ICD-10-CM

## 2019-01-02 DIAGNOSIS — Z361 Encounter for antenatal screening for raised alphafetoprotein level: Secondary | ICD-10-CM | POA: Diagnosis not present

## 2019-01-02 DIAGNOSIS — Z3689 Encounter for other specified antenatal screening: Secondary | ICD-10-CM | POA: Diagnosis present

## 2019-01-02 DIAGNOSIS — O09219 Supervision of pregnancy with history of pre-term labor, unspecified trimester: Secondary | ICD-10-CM

## 2019-01-02 DIAGNOSIS — O09212 Supervision of pregnancy with history of pre-term labor, second trimester: Secondary | ICD-10-CM

## 2019-01-02 DIAGNOSIS — O285 Abnormal chromosomal and genetic finding on antenatal screening of mother: Secondary | ICD-10-CM | POA: Insufficient documentation

## 2019-01-02 DIAGNOSIS — O34219 Maternal care for unspecified type scar from previous cesarean delivery: Secondary | ICD-10-CM

## 2019-01-02 NOTE — Consult Note (Signed)
Maternal-Fetal Medicine  Name: Regina Dawson, Regina Dawson MRN: [id number] Requesting Provider: Dr. Jac Canavan, MD  I had the pleasure of seeing Regina Dawson today at the [facility]. She is a G9 P6 at 60 w 6d gestation and is here for MFM consultation because of increased risk for open-neural tube defects on serum screening.  On serum screening (tetra), the risk for open spina bifida is increased at 1 and 5 (MSAFP 5.84 MoM).  The risk for Down syndrome is not increased.  Her pregnancy has been uneventful so far early pregnancy bleeding that resolved.  Obstetric history is significant for 4 spontaneous preterm deliveries (between 15 and 34 weeks).  Overall she has 6 healthy children.  She had 5 vaginal deliveries and most recently cesarean section (preterm).  In her previous pregnancy she took weekly progesterone injections but discontinued because of rashes.  She also reports 4 of her pregnancies were complicated by gestational hypertension or preeclampsia.  Patient reports she was advised to take low-dose aspirin prophylaxis but she decided against it because of fear of bleeding.  GYN history: No history of abnormal Pap smears or cervical surgeries.  Previous menstrual cycles were regular.  She reports no history of breast disease or biopsies. Past medical history: No history of hypertension or diabetes or thyroid disorders.  She reports she does not have sickle cell trait. Past surgical history: Cesarean section. Medications: Prenatal vitamins, Zofran, Prilosec. Allergies: No known drug allergies. Social history: She smokes cigarettes half pack per day.  No history of alcohol or drug use.  She lives with her partner who is African-American and he is in good health he is not the father of any of her previous children. Family history: No history of venous thromboembolism.  Ultrasound:  We performed a fetal anatomical survey.  No markers of aneuploidies or fetal structural defects are  seen.  Intracranial structures, fetal spine, abdominal wall insertion and the placenta looks normal.  Placenta is posterior and there is no evidence of previa or accreta.  Fetal anatomical survey could not be completed because of fetal position.  Fetal growth is appropriate for gestational age.  Amniotic fluid is normal and good fetal activity seen.  Because of her history of preterm deliveries, we performed transvaginal ultrasound to evaluate the cervical length.  The cervix measures 3.6 cm, which is normal.  I counseled the patient on the following: Increased risk for open-neural tube defects and increased AFP:  I reassured the patient of normal fetal anatomy on ultrasound including spine and intracranial structures. I informed her that ultrasound can detect up to 95 out of 100 cases of spina bifida and that amniocentesis will only marginally improve the detection rate.  Increased AFP can also be associated with fetal growth restriction (placental insufficiency), preterm delivery and stillbirth (very rare). I recommended serial fetal growth assessments. It can also follow vaginal bleeding.  In the absence of clear known cause, we recommend serial fetal growth assessments every 4 weeks.  History of preterm deliveries:  This is the single most-common cause of recurrent preterm birth. I informed her that studies have shown the beneficial effect of progesterone therapy in reducing the incidence of preterm births in women with history of preterm birth. Women with preterm births before 34 weeks benefited more from progesterone treatment.  However outpatient reports progesterone treatment was associated with side effects (rashes).    Alternatively, she can try vaginal progesterone (Prometrium 200 mg daily at night) until [redacted] weeks gestation.  This may  be an inferior option.  Vaginal progesterone is different from 17 alpha hydroxyprogesterone that is administered intramuscularly.  Patient will discuss with  you and decide.  Recommendations: -An appointment was made for her to return in 4 weeks for completion of fetal anatomy. -Fetal growth assessments every 4 weeks till delivery. -Vaginal prometrium 200 mg daily may be considered.  Thank you for your consult. Please do not hesitate to contact me if you have any questions or concerns.   Consultation including face-to-face counseling: 40 min.

## 2019-01-30 ENCOUNTER — Encounter (HOSPITAL_COMMUNITY): Payer: Self-pay | Admitting: *Deleted

## 2019-01-30 ENCOUNTER — Other Ambulatory Visit: Payer: Self-pay

## 2019-01-30 ENCOUNTER — Ambulatory Visit (HOSPITAL_COMMUNITY): Payer: Medicaid Other | Admitting: *Deleted

## 2019-01-30 ENCOUNTER — Other Ambulatory Visit (HOSPITAL_COMMUNITY): Payer: Self-pay | Admitting: *Deleted

## 2019-01-30 ENCOUNTER — Ambulatory Visit (HOSPITAL_COMMUNITY)
Admission: RE | Admit: 2019-01-30 | Discharge: 2019-01-30 | Disposition: A | Discharge status: Home / Self Care | Payer: Medicaid Other | Source: Ambulatory Visit | Attending: Obstetrics and Gynecology | Admitting: Obstetrics and Gynecology

## 2019-01-30 VITALS — BP 125/69 | HR 105 | Temp 98.8°F

## 2019-01-30 DIAGNOSIS — O09219 Supervision of pregnancy with history of pre-term labor, unspecified trimester: Secondary | ICD-10-CM | POA: Diagnosis present

## 2019-01-30 DIAGNOSIS — R772 Abnormality of alphafetoprotein: Secondary | ICD-10-CM | POA: Insufficient documentation

## 2019-01-30 DIAGNOSIS — O09212 Supervision of pregnancy with history of pre-term labor, second trimester: Secondary | ICD-10-CM | POA: Diagnosis not present

## 2019-01-30 DIAGNOSIS — O09292 Supervision of pregnancy with other poor reproductive or obstetric history, second trimester: Secondary | ICD-10-CM

## 2019-01-30 DIAGNOSIS — Z362 Encounter for other antenatal screening follow-up: Secondary | ICD-10-CM | POA: Diagnosis not present

## 2019-01-30 DIAGNOSIS — Z3A23 23 weeks gestation of pregnancy: Secondary | ICD-10-CM

## 2019-01-30 DIAGNOSIS — O34219 Maternal care for unspecified type scar from previous cesarean delivery: Secondary | ICD-10-CM | POA: Diagnosis not present

## 2019-02-03 LAB — GLUCOSE, 1 HOUR
Glucose 1 Hour: 177
Glucose 1 Hour: 177

## 2019-03-03 LAB — OB RESULTS CONSOLE HGB/HCT, BLOOD
HCT: 37 (ref 29–41)
Hemoglobin: 12.7

## 2019-03-03 LAB — OB RESULTS CONSOLE PLATELET COUNT: Platelets: 239

## 2019-03-04 ENCOUNTER — Ambulatory Visit (HOSPITAL_COMMUNITY): Payer: Medicaid Other | Admitting: *Deleted

## 2019-03-04 ENCOUNTER — Encounter (HOSPITAL_COMMUNITY): Payer: Self-pay

## 2019-03-04 ENCOUNTER — Other Ambulatory Visit: Payer: Self-pay

## 2019-03-04 ENCOUNTER — Other Ambulatory Visit (HOSPITAL_COMMUNITY): Payer: Self-pay | Admitting: *Deleted

## 2019-03-04 ENCOUNTER — Ambulatory Visit (HOSPITAL_COMMUNITY)
Admission: RE | Admit: 2019-03-04 | Discharge: 2019-03-04 | Disposition: A | Discharge status: Home / Self Care | Payer: Medicaid Other | Source: Ambulatory Visit | Attending: Obstetrics and Gynecology | Admitting: Obstetrics and Gynecology

## 2019-03-04 VITALS — BP 133/83 | HR 108 | Temp 97.8°F

## 2019-03-04 DIAGNOSIS — O34219 Maternal care for unspecified type scar from previous cesarean delivery: Secondary | ICD-10-CM

## 2019-03-04 DIAGNOSIS — R772 Abnormality of alphafetoprotein: Secondary | ICD-10-CM

## 2019-03-04 DIAGNOSIS — Z3A28 28 weeks gestation of pregnancy: Secondary | ICD-10-CM

## 2019-03-04 DIAGNOSIS — O09293 Supervision of pregnancy with other poor reproductive or obstetric history, third trimester: Secondary | ICD-10-CM

## 2019-03-04 DIAGNOSIS — O09213 Supervision of pregnancy with history of pre-term labor, third trimester: Secondary | ICD-10-CM | POA: Diagnosis not present

## 2019-03-04 DIAGNOSIS — Z362 Encounter for other antenatal screening follow-up: Secondary | ICD-10-CM

## 2019-03-04 LAB — GLUCOSE, 1 HOUR GESTATIONAL: Glucose, 1 Hr, gestational: 177

## 2019-03-25 LAB — OB RESULTS CONSOLE HGB/HCT, BLOOD
HCT: 37 (ref 29–41)
Hemoglobin: 12.8

## 2019-03-25 LAB — OB RESULTS CONSOLE PLATELET COUNT: Platelets: 254

## 2019-04-01 ENCOUNTER — Ambulatory Visit (HOSPITAL_COMMUNITY): Payer: Medicaid Other | Admitting: *Deleted

## 2019-04-01 ENCOUNTER — Ambulatory Visit (HOSPITAL_COMMUNITY)
Admission: RE | Admit: 2019-04-01 | Discharge: 2019-04-01 | Disposition: A | Discharge status: Home / Self Care | Payer: Medicaid Other | Source: Ambulatory Visit | Attending: Obstetrics | Admitting: Obstetrics

## 2019-04-01 ENCOUNTER — Other Ambulatory Visit: Payer: Self-pay

## 2019-04-01 ENCOUNTER — Encounter (HOSPITAL_COMMUNITY): Payer: Self-pay

## 2019-04-01 ENCOUNTER — Other Ambulatory Visit (HOSPITAL_COMMUNITY): Payer: Self-pay | Admitting: *Deleted

## 2019-04-01 VITALS — BP 130/84 | HR 111 | Temp 97.8°F

## 2019-04-01 DIAGNOSIS — R772 Abnormality of alphafetoprotein: Secondary | ICD-10-CM | POA: Diagnosis present

## 2019-04-01 DIAGNOSIS — Z3A32 32 weeks gestation of pregnancy: Secondary | ICD-10-CM

## 2019-04-01 DIAGNOSIS — O133 Gestational [pregnancy-induced] hypertension without significant proteinuria, third trimester: Secondary | ICD-10-CM

## 2019-04-01 DIAGNOSIS — O365930 Maternal care for other known or suspected poor fetal growth, third trimester, not applicable or unspecified: Secondary | ICD-10-CM

## 2019-04-01 DIAGNOSIS — O09213 Supervision of pregnancy with history of pre-term labor, third trimester: Secondary | ICD-10-CM

## 2019-04-01 DIAGNOSIS — Z362 Encounter for other antenatal screening follow-up: Secondary | ICD-10-CM

## 2019-04-01 DIAGNOSIS — O34219 Maternal care for unspecified type scar from previous cesarean delivery: Secondary | ICD-10-CM | POA: Diagnosis not present

## 2019-04-01 DIAGNOSIS — O09293 Supervision of pregnancy with other poor reproductive or obstetric history, third trimester: Secondary | ICD-10-CM

## 2019-04-01 DIAGNOSIS — O0990 Supervision of high risk pregnancy, unspecified, unspecified trimester: Secondary | ICD-10-CM | POA: Diagnosis present

## 2019-04-01 IMAGING — US US MFM OB FOLLOW-UP
1 series · 14 of 28 positions shown · non-contrast
Comparison: none

[Series 1: us mfm ob follow-up · 49 acquisitions, 14 frames shown]
[im 2/49]
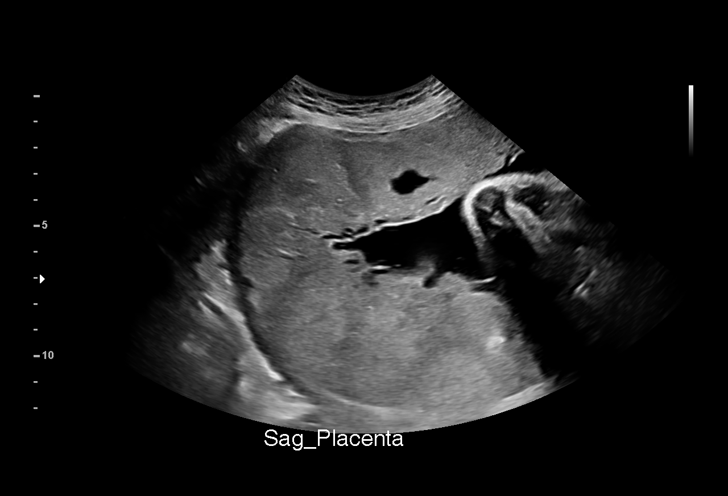
[im 6/49]
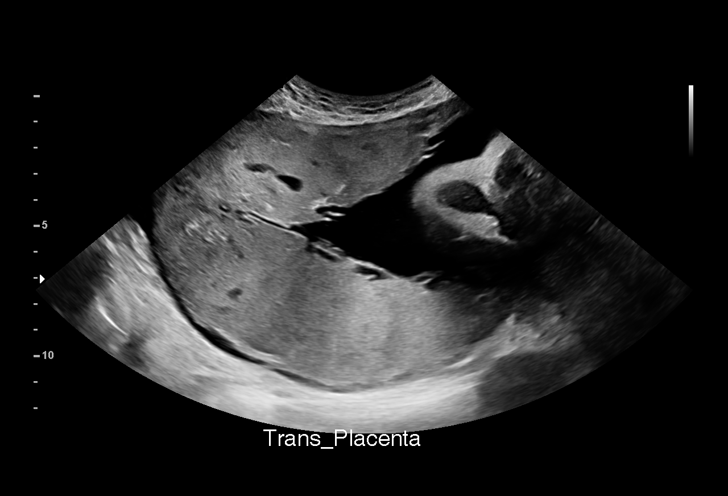
[im 9/49]
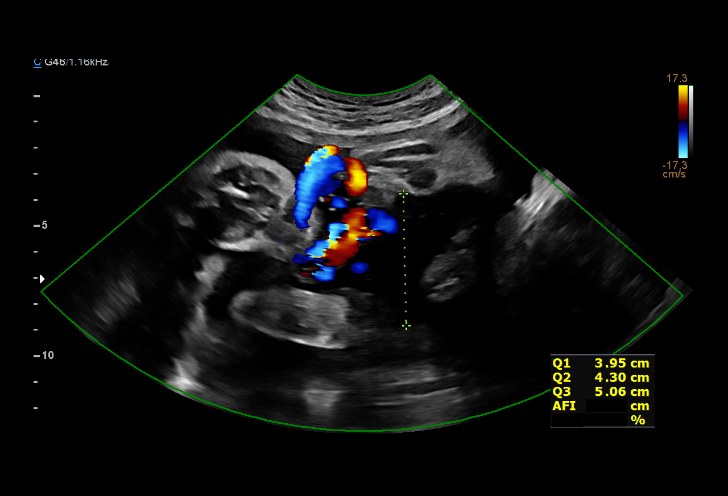
[im 13/49]
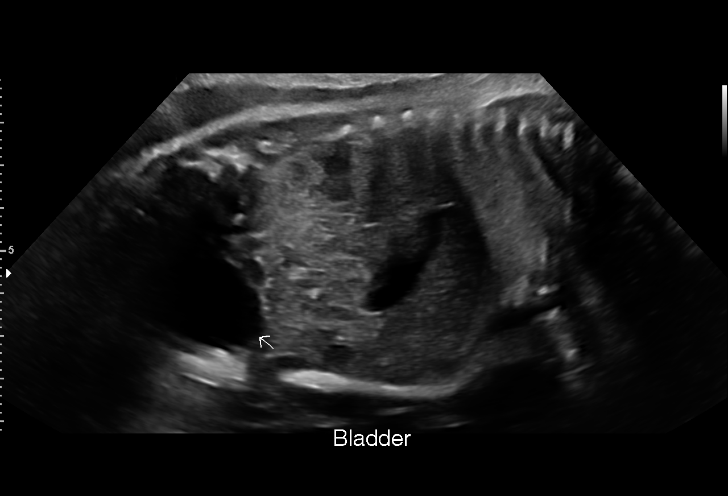
[im 17/49]
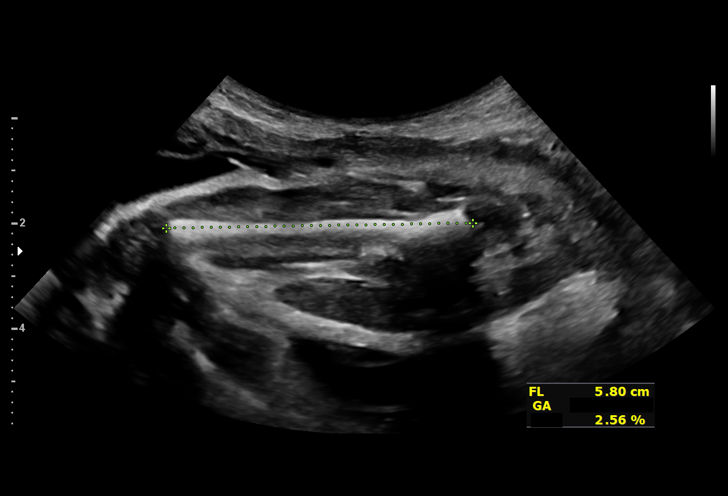
[im 20/49]
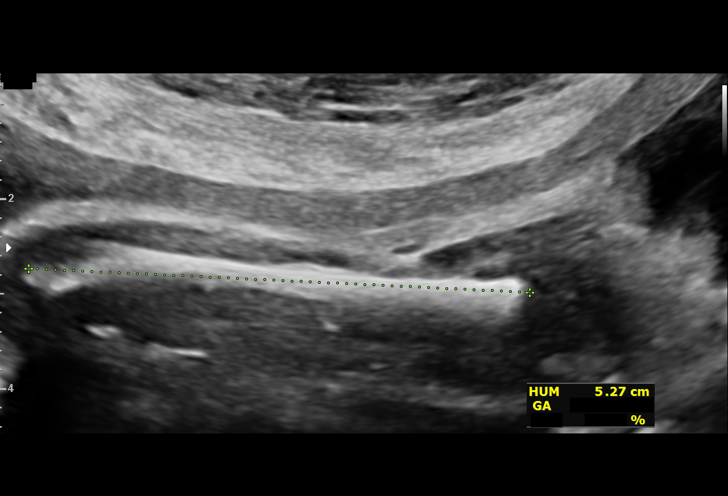
[im 24/49]
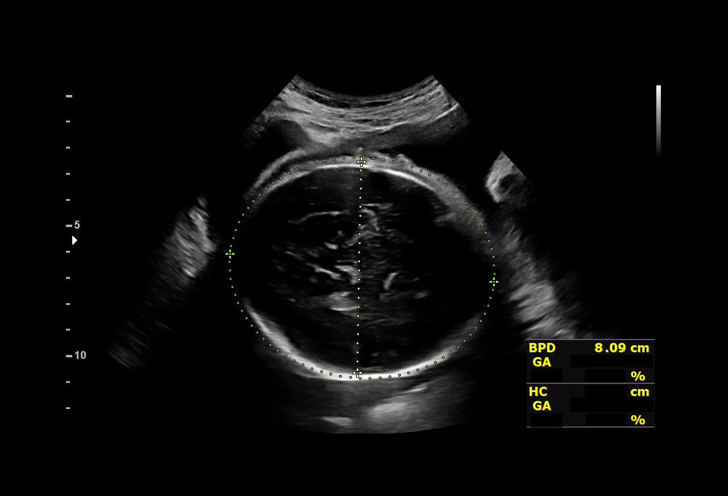
[im 27/49]
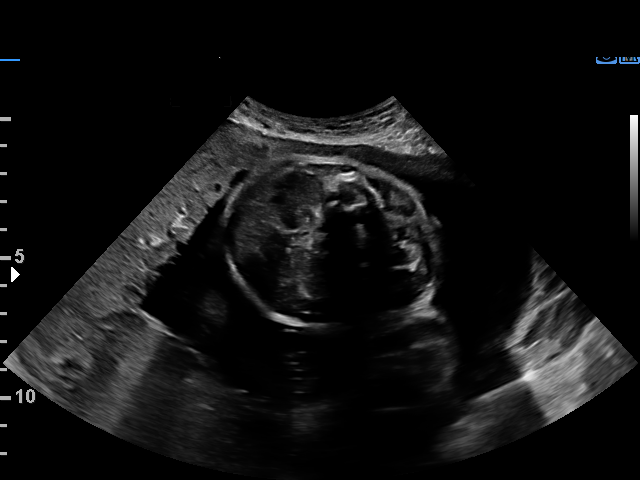
[im 31/49]
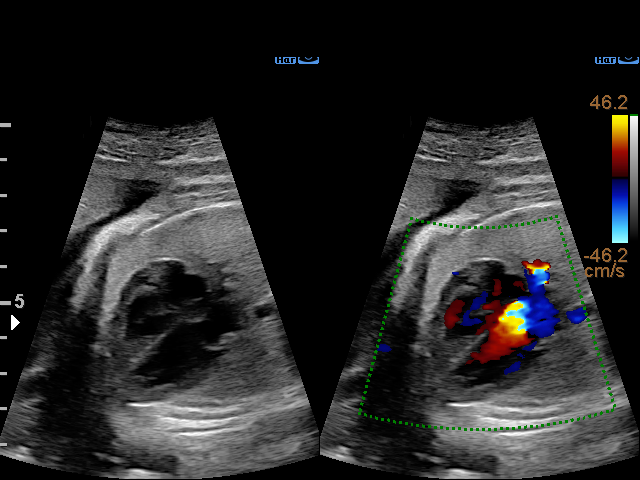
[im 34/49]
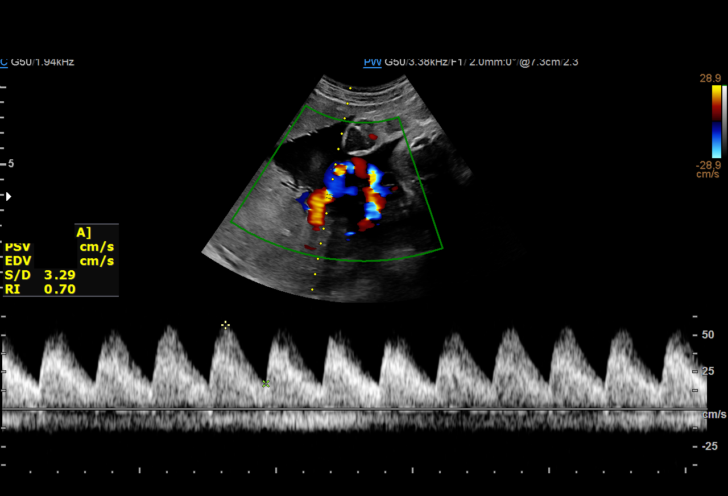
[im 38/49]
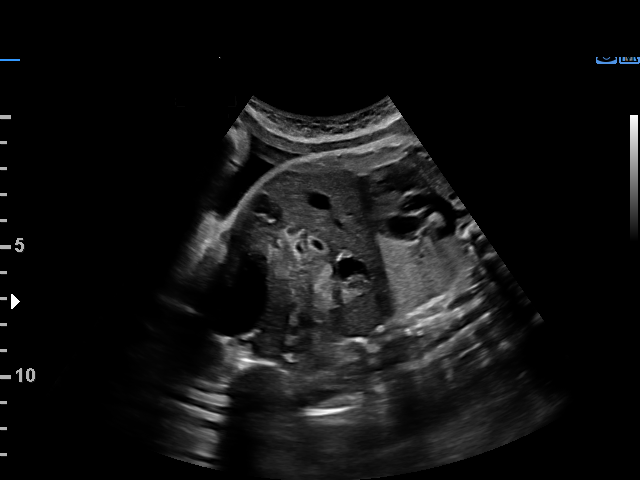
[im 41/49]
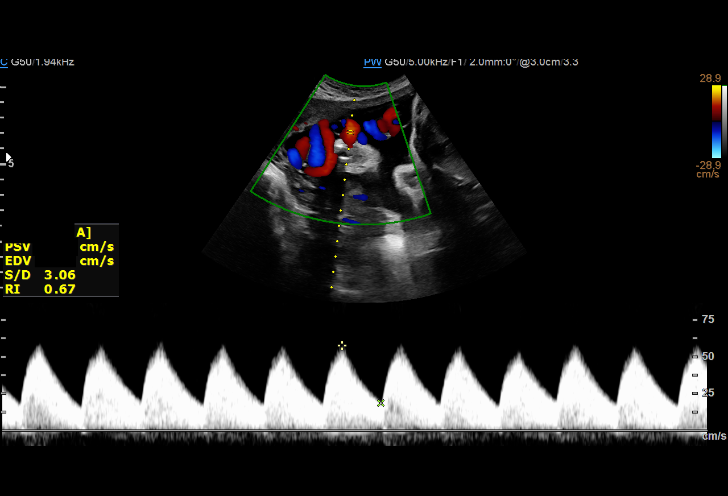
[im 45/49]
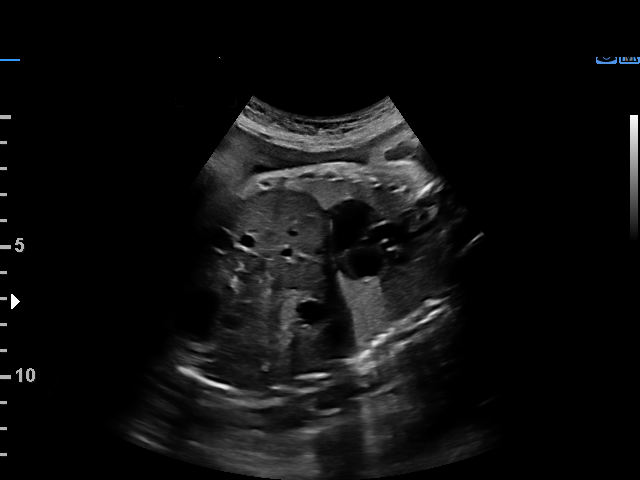
[im 49/49]
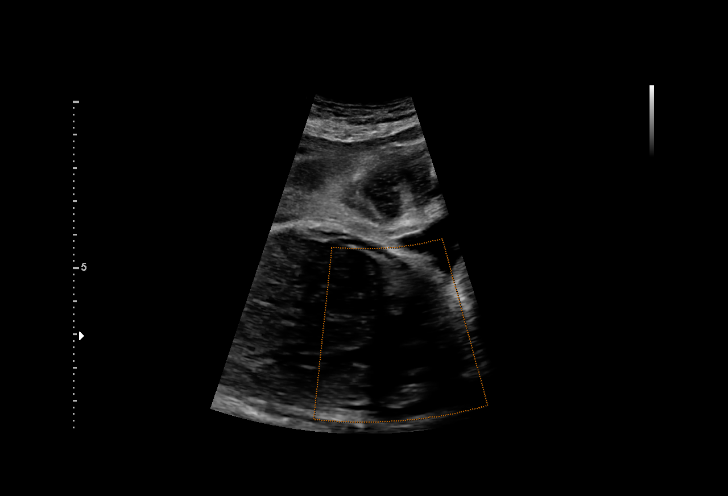

[14 of 28 positions shown; findings below may reference images not displayed]

[HOSPITAL][HOSPITAL]

 ----------------------------------------------------------------------

 ----------------------------------------------------------------------
Indications

  Encounter for other antenatal screening        [7Q]
  follow-up
  Antenatal screening for elevated               [7Q]
  alphafetoprotein level (AFP) (AFP 5.84 MoM;
  OSBR 1 in 5)
  Gestational hypertension without significant   [7Q]
  proteinuria, third trimester (on labetalol)
  Poor obstetric history: Previous preterm       [7Q]
  delivery, antepartum x 3 (22wk demise,
  [7Q]) has had term deliveries
  Poor obstetric history: Previous               [7Q]
  preeclampsia / eclampsia/gestational HTN
  Previous cesarean delivery, antepartum         [7Q]
  32 weeks gestation of pregnancy
 ----------------------------------------------------------------------
Fetal Evaluation

 Num Of Fetuses:         1
 Fetal Heart Rate(bpm):  135
 Cardiac Activity:       Observed
 Presentation:           Cephalic
 Placenta:               Posterior Fundal
 P. Cord Insertion:      Marginal insertion prev seen

 Amniotic Fluid
 AFI FV:      Within normal limits

 AFI Sum(cm)     %Tile       Largest Pocket(cm)
 17.78           65
 RUQ(cm)       RLQ(cm)       LUQ(cm)        LLQ(cm)

Biophysical Evaluation

 Amniotic F.V:   Pocket => 2 cm             F. Tone:        Observed
 F. Movement:    Observed                   Score:          [DATE]
 F. Breathing:   Observed
Biometry

 BPD:      81.4  mm     G. Age:  32w 5d         47  %    CI:        77.83   %    70 - 86
                                                         FL/HC:      19.6   %    19.9 -
 HC:       292   mm     G. Age:  32w 1d          9  %    HC/AC:      1.12        0.96 -
 AC:      260.6  mm     G. Age:  30w 1d          3  %    FL/BPD:     70.4   %    71 - 87
 FL:       57.3  mm     G. Age:  30w 0d          1  %    FL/AC:      22.0   %    20 - 24
 HUM:      46.8  mm     G. Age:  27w 4d        < 5  %

 Est. FW:    [7Q]  gm      3 lb 8 oz      3  %
OB History

 Gravidity:    9         Term:   3        Prem:   3        SAB:   1
 TOP:          0       Ectopic:  0        Living: 6
Gestational Age

 LMP:           32w 4d        Date:  [DATE]                 EDD:   [DATE]
 U/S Today:     31w 2d                                        EDD:   [DATE]
 Best:          32w 4d     Det. By:  LMP  ([DATE])          EDD:   [DATE]
Doppler - Fetal Vessels

 Umbilical Artery
  S/D     %tile
 2.97       70

Impression

 Patient returns for fetal growth assessment.  On midtrimester
 screening, MSAFP was increased at 5.84 MoM.  Also,
 marginal cord insertion is present.  Patient reports that
 following increased blood pressure at prenatal visits, her
 doctor prescribed labetalol 100 mg twice daily.  She has a
 diagnosis of gestational hypertension.
 She does not have symptoms of severe headache or right
 upper quadrant pain.  She reports good fetal movements.
 On today's ultrasound, the estimated fetal weight is at the 3rd
 percentile.  Abdominal circumference measurement is at the
 3rd percentile.  Amniotic fluid is normal and good fetal activity
 seen.  Antenatal testing is reassuring.  BPP [DATE].  Umbilical
 artery Doppler showed normal forward diastolic flow.
 I explained the finding of severe fetal growth restriction
 explained the importance of weekly antenatal testing.  She
 has several causes of growth restriction including gestational
 hypertension, midtrimester elevation in MSAFP marginal cord
 insertion.
 Patient also informed that she would like to deliver at
 [REDACTED] at [HOSPITAL] and will be discussing with
 you.
Recommendations

 -Weekly appointments for antenatal testing were made.
 -Fetal growth assessment in 3 weeks.
 -Delivery at 37 weeks gestation.
                 CLAVENSKY

## 2019-04-04 NOTE — L&D Delivery Note (Signed)
OB/GYN Faculty Practice Delivery Note  TIMIRA BIEDA is a 34 y.o. [id number] s/p vaginal delivery at [redacted]w[redacted]d. She was admitted for IOL 2/2 severe IUGR.   ROM: 0h 71m with clear fluid GBS Status: positive, adequately treated with PCN Maximum Maternal Temperature: 98.3 F  Labor Progress: Patient's induction started with FB and low dose pitocin. She progressed to 5 cm and was AROMed, delivery shortly after.  Delivery Date/Time: 05/02/19, 1838 hours Delivery: Called to room and patient was complete and pushing. Head delivered OA. No nuchal cord present. Shoulder and body delivered in usual fashion. Infant with spontaneous cry, placed on mother's abdomen, dried and stimulated. Cord clamped x 2 after 1-minute delay, and cut by the patient under my direct supervision. Cord blood drawn.   During active management of the third stage, the umbilical cord spontaneously avulsed. Dr. Shawnie Pons called to bedside for supervision of manual removal after 100 mcg fentanyl given to patient for comfort. The retained part of the placenta was manually removed, and appeared to have no missing cotyledons. Placenta was sent to pathology.  Fundus firm with massage and Pitocin. Labia, perineum, vagina, and cervix were inspected, and found to be intact.   Placenta: retained, manually removed in pieces, to pathology Complications: retained placenta Lacerations: none EBL: 75 mL, she received TXA prior to delivery Analgesia: None  Postpartum Planning [x]  message to sent to schedule follow-up  [x]  vaccines UTD  Infant: female  APGARs 6, 9  2025 g  , DO OB/GYN Fellow, Faculty Practice

## 2019-04-08 ENCOUNTER — Encounter (HOSPITAL_COMMUNITY): Payer: Self-pay | Admitting: *Deleted

## 2019-04-08 ENCOUNTER — Other Ambulatory Visit: Payer: Self-pay

## 2019-04-08 ENCOUNTER — Ambulatory Visit (HOSPITAL_COMMUNITY)
Admission: RE | Admit: 2019-04-08 | Discharge: 2019-04-08 | Disposition: A | Discharge status: Home / Self Care | Payer: Medicaid Other | Source: Ambulatory Visit | Attending: Obstetrics and Gynecology | Admitting: Obstetrics and Gynecology

## 2019-04-08 ENCOUNTER — Ambulatory Visit (HOSPITAL_BASED_OUTPATIENT_CLINIC_OR_DEPARTMENT_OTHER)
Admission: RE | Admit: 2019-04-08 | Discharge: 2019-04-08 | Disposition: A | Discharge status: Home / Self Care | Payer: Medicaid Other | Source: Ambulatory Visit

## 2019-04-08 ENCOUNTER — Ambulatory Visit (HOSPITAL_COMMUNITY): Payer: Medicaid Other | Admitting: *Deleted

## 2019-04-08 VITALS — BP 135/76 | HR 106 | Temp 97.7°F

## 2019-04-08 DIAGNOSIS — O365990 Maternal care for other known or suspected poor fetal growth, unspecified trimester, not applicable or unspecified: Secondary | ICD-10-CM

## 2019-04-08 DIAGNOSIS — O133 Gestational [pregnancy-induced] hypertension without significant proteinuria, third trimester: Secondary | ICD-10-CM

## 2019-04-08 DIAGNOSIS — O09213 Supervision of pregnancy with history of pre-term labor, third trimester: Secondary | ICD-10-CM | POA: Diagnosis not present

## 2019-04-08 DIAGNOSIS — O365930 Maternal care for other known or suspected poor fetal growth, third trimester, not applicable or unspecified: Secondary | ICD-10-CM | POA: Diagnosis present

## 2019-04-08 DIAGNOSIS — O34219 Maternal care for unspecified type scar from previous cesarean delivery: Secondary | ICD-10-CM

## 2019-04-08 DIAGNOSIS — Z3A33 33 weeks gestation of pregnancy: Secondary | ICD-10-CM

## 2019-04-08 DIAGNOSIS — O24419 Gestational diabetes mellitus in pregnancy, unspecified control: Secondary | ICD-10-CM

## 2019-04-08 DIAGNOSIS — O09293 Supervision of pregnancy with other poor reproductive or obstetric history, third trimester: Secondary | ICD-10-CM

## 2019-04-08 DIAGNOSIS — O43193 Other malformation of placenta, third trimester: Secondary | ICD-10-CM

## 2019-04-09 ENCOUNTER — Encounter: Payer: Self-pay | Admitting: *Deleted

## 2019-04-09 DIAGNOSIS — O0990 Supervision of high risk pregnancy, unspecified, unspecified trimester: Secondary | ICD-10-CM | POA: Insufficient documentation

## 2019-04-10 ENCOUNTER — Other Ambulatory Visit: Payer: Self-pay

## 2019-04-10 ENCOUNTER — Other Ambulatory Visit (HOSPITAL_COMMUNITY): Payer: Self-pay | Admitting: Obstetrics and Gynecology

## 2019-04-10 DIAGNOSIS — O365990 Maternal care for other known or suspected poor fetal growth, unspecified trimester, not applicable or unspecified: Secondary | ICD-10-CM

## 2019-04-14 ENCOUNTER — Other Ambulatory Visit (HOSPITAL_COMMUNITY): Payer: Self-pay | Admitting: *Deleted

## 2019-04-14 DIAGNOSIS — O365990 Maternal care for other known or suspected poor fetal growth, unspecified trimester, not applicable or unspecified: Secondary | ICD-10-CM

## 2019-04-16 ENCOUNTER — Ambulatory Visit (HOSPITAL_COMMUNITY): Payer: Medicaid Other

## 2019-04-16 ENCOUNTER — Ambulatory Visit (HOSPITAL_COMMUNITY): Payer: Medicaid Other | Admitting: *Deleted

## 2019-04-16 ENCOUNTER — Other Ambulatory Visit (HOSPITAL_COMMUNITY): Payer: Self-pay | Admitting: Obstetrics and Gynecology

## 2019-04-16 ENCOUNTER — Other Ambulatory Visit: Payer: Self-pay

## 2019-04-16 ENCOUNTER — Encounter: Payer: Medicaid Other | Admitting: Obstetrics and Gynecology

## 2019-04-16 ENCOUNTER — Encounter (HOSPITAL_COMMUNITY): Payer: Self-pay | Admitting: *Deleted

## 2019-04-16 ENCOUNTER — Ambulatory Visit (HOSPITAL_COMMUNITY)
Admission: RE | Admit: 2019-04-16 | Discharge: 2019-04-16 | Disposition: A | Discharge status: Home / Self Care | Payer: Medicaid Other | Source: Ambulatory Visit | Attending: Obstetrics and Gynecology | Admitting: Obstetrics and Gynecology

## 2019-04-16 DIAGNOSIS — O365930 Maternal care for other known or suspected poor fetal growth, third trimester, not applicable or unspecified: Secondary | ICD-10-CM

## 2019-04-16 DIAGNOSIS — O09213 Supervision of pregnancy with history of pre-term labor, third trimester: Secondary | ICD-10-CM

## 2019-04-16 DIAGNOSIS — O24419 Gestational diabetes mellitus in pregnancy, unspecified control: Secondary | ICD-10-CM | POA: Diagnosis not present

## 2019-04-16 DIAGNOSIS — O133 Gestational [pregnancy-induced] hypertension without significant proteinuria, third trimester: Secondary | ICD-10-CM

## 2019-04-16 DIAGNOSIS — O0990 Supervision of high risk pregnancy, unspecified, unspecified trimester: Secondary | ICD-10-CM | POA: Insufficient documentation

## 2019-04-16 DIAGNOSIS — O365990 Maternal care for other known or suspected poor fetal growth, unspecified trimester, not applicable or unspecified: Secondary | ICD-10-CM | POA: Diagnosis not present

## 2019-04-16 DIAGNOSIS — O09293 Supervision of pregnancy with other poor reproductive or obstetric history, third trimester: Secondary | ICD-10-CM

## 2019-04-16 DIAGNOSIS — O34219 Maternal care for unspecified type scar from previous cesarean delivery: Secondary | ICD-10-CM

## 2019-04-16 DIAGNOSIS — Z3A34 34 weeks gestation of pregnancy: Secondary | ICD-10-CM

## 2019-04-16 DIAGNOSIS — O43193 Other malformation of placenta, third trimester: Secondary | ICD-10-CM

## 2019-04-16 NOTE — Procedures (Signed)
Regina Dawson [birth year] [redacted]w[redacted]d  Fetus A Non-Stress Test Interpretation for 04/16/19  Indication: Unsatisfactory BPP  Fetal Heart Rate A Mode: External Baseline Rate (A): 145 bpm Variability: Moderate Accelerations: 15 x 15 Decelerations: None Multiple birth?: No  Uterine Activity Mode: Palpation, Toco Contraction Frequency (min): 6-9 Contraction Duration (sec): 50-70 Contraction Quality: Mild Resting Tone Palpated: Relaxed Resting Time: Adequate  Interpretation (Fetal Testing) Nonstress Test Interpretation: Reactive Comments: Reviewed tracing with Dr. Judeth Cornfield

## 2019-04-17 ENCOUNTER — Ambulatory Visit (HOSPITAL_COMMUNITY): Payer: Medicaid Other

## 2019-04-17 ENCOUNTER — Encounter: Payer: Self-pay | Admitting: Obstetrics & Gynecology

## 2019-04-17 ENCOUNTER — Ambulatory Visit (INDEPENDENT_AMBULATORY_CARE_PROVIDER_SITE_OTHER): Payer: Medicaid Other | Admitting: Obstetrics & Gynecology

## 2019-04-17 ENCOUNTER — Encounter: Payer: Self-pay | Admitting: *Deleted

## 2019-04-17 DIAGNOSIS — O133 Gestational [pregnancy-induced] hypertension without significant proteinuria, third trimester: Secondary | ICD-10-CM | POA: Diagnosis not present

## 2019-04-17 DIAGNOSIS — Z3A34 34 weeks gestation of pregnancy: Secondary | ICD-10-CM

## 2019-04-17 DIAGNOSIS — O0993 Supervision of high risk pregnancy, unspecified, third trimester: Secondary | ICD-10-CM

## 2019-04-17 DIAGNOSIS — O09293 Supervision of pregnancy with other poor reproductive or obstetric history, third trimester: Secondary | ICD-10-CM

## 2019-04-17 DIAGNOSIS — O34219 Maternal care for unspecified type scar from previous cesarean delivery: Secondary | ICD-10-CM | POA: Insufficient documentation

## 2019-04-17 DIAGNOSIS — O139 Gestational [pregnancy-induced] hypertension without significant proteinuria, unspecified trimester: Secondary | ICD-10-CM

## 2019-04-17 DIAGNOSIS — O0990 Supervision of high risk pregnancy, unspecified, unspecified trimester: Secondary | ICD-10-CM

## 2019-04-17 DIAGNOSIS — Z23 Encounter for immunization: Secondary | ICD-10-CM | POA: Diagnosis not present

## 2019-04-17 DIAGNOSIS — O365990 Maternal care for other known or suspected poor fetal growth, unspecified trimester, not applicable or unspecified: Secondary | ICD-10-CM | POA: Insufficient documentation

## 2019-04-17 DIAGNOSIS — O09299 Supervision of pregnancy with other poor reproductive or obstetric history, unspecified trimester: Secondary | ICD-10-CM | POA: Insufficient documentation

## 2019-04-17 DIAGNOSIS — O365930 Maternal care for other known or suspected poor fetal growth, third trimester, not applicable or unspecified: Secondary | ICD-10-CM

## 2019-04-17 DIAGNOSIS — Z8751 Personal history of pre-term labor: Secondary | ICD-10-CM

## 2019-04-17 NOTE — Patient Instructions (Signed)

## 2019-04-17 NOTE — Progress Notes (Signed)
  Subjective:Trnasfer from Dr. Shawnie Pons for delivery at 37 weeks    Regina Dawson is a [id number] [redacted]w[redacted]d being seen today for her first obstetrical visit.  Her obstetrical history is significant for pregnancy induced hypertension and IUGR, smoker. Patient does intend to breast feed. Pregnancy history fully reviewed.  Patient reports no complaints.  Vitals:   04/17/19 0907  BP: 118/78  Pulse: (!) 112  Temp: 98.4 F (36.9 C)  Weight: 141 lb 12.8 oz (64.3 kg)    HISTORY: OB History  Gravida Para Term Preterm AB Living  8 7 3 4  0 6  SAB TAB Ectopic Multiple Live Births  0       7    # Outcome Date GA Lbr Len/2nd Weight Sex Delivery Anes PTL Lv  8 Current           7 Preterm 07/15/13 [redacted]w[redacted]d  5 lb 5 oz (2.41 kg) F CS-LTranv  N LIV     Complications: Abruptio Placenta  6 Preterm 03/22/11 [redacted]w[redacted]d  5 lb 5 oz (2.41 kg) F Vag-Spont  N LIV  5 Preterm 03/30/10 [redacted]w[redacted]d   [redacted]w[redacted]d  Y ND  4 Term 06/22/09 [redacted]w[redacted]d  6 lb 9 oz (2.977 kg) F Vag-Spont  N LIV     Complications: Other Excessive Bleeding  3 Term 08/28/07    F Vag-Spont  N LIV  2 Term 07/21/06 [redacted]w[redacted]d  6 lb 5 oz (2.863 kg) M Vag-Spont  Y LIV  1 Preterm 12/02/03 [redacted]w[redacted]d  4 lb 3 oz (1.899 kg) M Vag-Spont  N LIV   Past Medical History:  Diagnosis Date  . Bipolar depression (HCC)   . Hx of preeclampsia, prior pregnancy, currently pregnant   . Hypertension    Pre-eclampsia    Past Surgical History:  Procedure Laterality Date  . CESAREAN SECTION    . WISDOM TOOTH EXTRACTION     History reviewed. No pertinent family history.   Exam    Uterus:     Pelvic Exam:    Perineum: No Hemorrhoids   Vulva: normal   Vagina:  normal mucosa   pH:     Cervix: no lesions   Adnexa: not evaluated   Bony Pelvis: average       Skin: normal coloration and turgor, no rashes    Neurologic: oriented, normal mood   Extremities: normal strength, tone, and muscle mass   HEENT extra ocular movement intact   Mouth/Teeth dental hygiene good   Neck supple    Cardiovascular: regular rate and rhythm   Respiratory:  appears well, vitals normal, no respiratory distress, acyanotic, normal RR, neck free of mass or lymphadenopathy   Abdomen: gravid   Urinary: urethral meatus normal      Assessment:    Pregnancy: [redacted]w[redacted]d Patient Active Problem List   Diagnosis Date Noted  . History of preterm delivery 04/17/2019  . History of cesarean delivery affecting pregnancy 04/17/2019  . Hx of preeclampsia, prior pregnancy, currently pregnant   . Gestational hypertension, antepartum   . Supervision of high risk pregnancy, antepartum 04/09/2019        Plan:     Initial labs drawn. Prenatal vitamins. Problem list reviewed and updated. [facility] Scre[facility] in [facility]  Ultrasound discussed; fetal survey: results reviewed.  Follow up in 1 weeks. 50% of 30 min visit spent on counseling and coordination of care.  FU in MFM. Wants TOLAC, sign consent, risk f uterine rupture discussed   [facility] 04/17/2019

## 2019-04-17 NOTE — Progress Notes (Signed)
Pt states she tested positive for Trich yesterday - received Rx.

## 2019-04-18 ENCOUNTER — Other Ambulatory Visit: Payer: Self-pay

## 2019-04-18 ENCOUNTER — Inpatient Hospital Stay (HOSPITAL_COMMUNITY)
Admission: EM | Admit: 2019-04-18 | Discharge: 2019-04-18 | Disposition: A | Discharge status: Home / Self Care | Payer: Medicaid Other | Attending: Obstetrics and Gynecology | Admitting: Obstetrics and Gynecology

## 2019-04-18 ENCOUNTER — Encounter (HOSPITAL_COMMUNITY): Payer: Self-pay | Admitting: Obstetrics and Gynecology

## 2019-04-18 DIAGNOSIS — O99333 Smoking (tobacco) complicating pregnancy, third trimester: Secondary | ICD-10-CM | POA: Diagnosis not present

## 2019-04-18 DIAGNOSIS — O368130 Decreased fetal movements, third trimester, not applicable or unspecified: Secondary | ICD-10-CM | POA: Diagnosis not present

## 2019-04-18 DIAGNOSIS — E876 Hypokalemia: Secondary | ICD-10-CM | POA: Insufficient documentation

## 2019-04-18 DIAGNOSIS — Z79899 Other long term (current) drug therapy: Secondary | ICD-10-CM | POA: Diagnosis not present

## 2019-04-18 DIAGNOSIS — Z3A35 35 weeks gestation of pregnancy: Secondary | ICD-10-CM | POA: Diagnosis not present

## 2019-04-18 DIAGNOSIS — I10 Essential (primary) hypertension: Secondary | ICD-10-CM | POA: Diagnosis not present

## 2019-04-18 DIAGNOSIS — F17210 Nicotine dependence, cigarettes, uncomplicated: Secondary | ICD-10-CM | POA: Insufficient documentation

## 2019-04-18 DIAGNOSIS — O0990 Supervision of high risk pregnancy, unspecified, unspecified trimester: Secondary | ICD-10-CM

## 2019-04-18 DIAGNOSIS — O4703 False labor before 37 completed weeks of gestation, third trimester: Secondary | ICD-10-CM | POA: Diagnosis present

## 2019-04-18 DIAGNOSIS — Z3689 Encounter for other specified antenatal screening: Secondary | ICD-10-CM | POA: Diagnosis not present

## 2019-04-18 DIAGNOSIS — O26893 Other specified pregnancy related conditions, third trimester: Secondary | ICD-10-CM | POA: Diagnosis not present

## 2019-04-18 LAB — CBC
HCT: 38.5 % (ref 36.0–46.0)
Hemoglobin: 13.2 g/dL (ref 12.0–15.0)
MCH: 30.4 pg (ref 26.0–34.0)
MCHC: 34.3 g/dL (ref 30.0–36.0)
MCV: 88.7 fL (ref 80.0–100.0)
Platelets: 222 10*3/uL (ref 150–400)
RBC: 4.34 MIL/uL (ref 3.87–5.11)
RDW: 13.2 % (ref 11.5–15.5)
WBC: 7 10*3/uL (ref 4.0–10.5)
nRBC: 0 % (ref 0.0–0.2)

## 2019-04-18 LAB — URINALYSIS, ROUTINE W REFLEX MICROSCOPIC
Bilirubin Urine: NEGATIVE
Glucose, UA: 50 mg/dL — AB
Hgb urine dipstick: NEGATIVE
Ketones, ur: 80 mg/dL — AB
Leukocytes,Ua: NEGATIVE
Nitrite: NEGATIVE
Protein, ur: NEGATIVE mg/dL
Specific Gravity, Urine: 1.012 (ref 1.005–1.030)
pH: 6 (ref 5.0–8.0)

## 2019-04-18 LAB — COMPREHENSIVE METABOLIC PANEL
ALT: 25 U/L (ref 0–44)
AST: 26 U/L (ref 15–41)
Albumin: 3 g/dL — ABNORMAL LOW (ref 3.5–5.0)
Alkaline Phosphatase: 304 U/L — ABNORMAL HIGH (ref 38–126)
Anion gap: 12 (ref 5–15)
BUN: <5 mg/dL — ABNORMAL LOW (ref 6–20)
CO2: 19 mmol/L — ABNORMAL LOW (ref 22–32)
Calcium: 8.9 mg/dL (ref 8.9–10.3)
Chloride: 103 mmol/L (ref 98–111)
Creatinine, Ser: 0.61 mg/dL (ref 0.44–1.00)
GFR calc Af Amer: >60 mL/min (ref >60)
GFR calc non Af Amer: >60 mL/min (ref >60)
Glucose, Bld: 76 mg/dL (ref 70–99)
Potassium: 2.9 mmol/L — ABNORMAL LOW (ref 3.5–5.1)
Sodium: 134 mmol/L — ABNORMAL LOW (ref 135–145)
Total Bilirubin: 0.7 mg/dL (ref 0.3–1.2)
Total Protein: 6.8 g/dL (ref 6.5–8.1)

## 2019-04-18 LAB — PROTEIN / CREATININE RATIO, URINE
Creatinine, Urine: 126.6 mg/dL
Protein Creatinine Ratio: 0.17 mg/mg{Cre} — ABNORMAL HIGH (ref 0.00–0.15)
Total Protein, Urine: 22 mg/dL

## 2019-04-18 MED ORDER — POTASSIUM CHLORIDE CRYS ER 20 MEQ PO TBCR: 20.0000 meq | EXTENDED_RELEASE_TABLET | Freq: Two times a day (BID) | ORAL | 0 refills | Status: DC

## 2019-04-18 MED ORDER — CYCLOBENZAPRINE HCL 10 MG PO TABS
10.0000 mg | ORAL_TABLET | Freq: Three times a day (TID) | ORAL | Status: DC | PRN
  Administered 2019-04-18: 10 mg via ORAL
  Filled 2019-04-18: qty 1

## 2019-04-18 NOTE — MAU Provider Note (Signed)
History     CSN: [id number]  Arrival date and time: 04/18/19 1726   First Provider Initiated Contact with Patient 04/18/19 1809      Chief Complaint  Patient presents with  . Contractions   [id number] @35 .0 wks presenting with ctx. Reports onset last night. She is unsure of frequency but becoming more often. No VB or LOF. +FM but less today. She was getting care at Dr. and transferred to [facility] with her first visit yesterday. Hx of gHTN, FGR, and marginal CI. She was also recently treated for trich 2 days ago.   OB History    Gravida  8   Para  7   Term  3   Preterm  4   AB  0   Living  6     SAB  0   TAB      Ectopic      Multiple      Live Births  7           Past Medical History:  Diagnosis Date  . Bipolar depression (HCC)   . Hx of preeclampsia, prior pregnancy, currently pregnant   . Hypertension    Pre-eclampsia     Past Surgical History:  Procedure Laterality Date  . CESAREAN SECTION    . WISDOM TOOTH EXTRACTION      History reviewed. No pertinent family history.  Social History   Tobacco Use  . Smoking status: Current Every Day Smoker    Packs/day: 0.50    Types: Cigarettes  . Smokeless tobacco: Never Used  Substance Use Topics  . Alcohol use: Not Currently  . Drug use: Not Currently    Allergies:  Allergies  Allergen Reactions  . Makena [Hydroxyprogesterone Caproate] Hives    Medications Prior to Admission  Medication Sig Dispense Refill Last Dose  . cyclobenzaprine (FLEXERIL) 10 MG tablet Take 10 mg by mouth 3 (three) times daily as needed for muscle spasms.   Past Month at Unknown time  . Doxylamine-Pyridoxine (DICLEGIS) 10-10 MG TBEC Take by mouth.   Past Month at Unknown time  . labetalol (NORMODYNE) 100 MG tablet Take 100 mg by mouth 2 (two) times daily.   04/18/2019 at Unknown time  . omeprazole (PRILOSEC) 20 MG capsule Take 40 mg by mouth daily.    04/18/2019 at Unknown time  . ondansetron (ZOFRAN-ODT) 4 MG  disintegrating tablet Take 4 mg by mouth every 8 (eight) hours as needed for nausea or vomiting.   Past Month at Unknown time  . Prenat w/o A FeCbnFeGlu-FA &B6 (CITRANATAL B-CALM) 20-1 MG & 2 x 25 MG MISC Take by mouth.   04/17/2019 at Unknown time  . Promethazine HCl (PHENERGAN PO) Take by mouth.   04/17/2019 at Unknown time  . chlorproMAZINE (THORAZINE) 25 MG tablet Take 25 mg by mouth 2 (two) times daily.       Review of Systems  Eyes: Negative for visual disturbance.  Gastrointestinal: Positive for abdominal pain. Negative for nausea and vomiting.  Genitourinary: Negative for vaginal bleeding and vaginal discharge.  Musculoskeletal: Positive for back pain.  Neurological: Negative for headaches.   Physical Exam   Blood pressure 129/80, pulse (!) 104, temperature 99 F (37.2 C), temperature source Oral, resp. rate 17, height 5\' 5"  (1.651 m), weight 64.6 kg, last menstrual period 08/16/2018, SpO2 100 %.  Patient Vitals for the past 24 hrs:  BP Temp Temp src Pulse Resp SpO2 Height Weight  04/18/19 1900 129/80 -- -- (!) 104 --  100 % -- --  04/18/19 1845 135/82 -- -- (!) 117 -- 100 % -- --  04/18/19 1830 (!) 142/88 -- -- (!) 115 -- 100 % -- --  04/18/19 1815 (!) 145/83 -- -- (!) 120 -- 100 % -- --  04/18/19 1800 (!) 147/94 -- -- (!) 128 -- 100 % -- --  04/18/19 1753 (!) 148/93 99 F (37.2 C) Oral (!) 114 17 100 % 5\' 5"  (1.651 m) 64.6 kg  04/18/19 1730 -- -- -- -- 16 -- -- --  04/18/19 1729 (!) 141/91 99 F (37.2 C) Oral (!) 129 -- 100 % -- --    Physical Exam  Nursing note and vitals reviewed. Constitutional: She is oriented to person, place, and time. She appears well-developed and well-nourished. No distress.  HENT:  Head: Normocephalic and atraumatic.  Cardiovascular: Normal rate.  Respiratory: Effort normal. No respiratory distress.  GI: Soft. She exhibits no distension. There is no abdominal tenderness.  gravid  Genitourinary:    Genitourinary Comments: VE: 1/50/-2, vtx    Musculoskeletal:        General: Normal range of motion.     Cervical back: Normal range of motion.  Neurological: She is alert and oriented to person, place, and time.  Skin: Skin is warm and dry.  Psychiatric: She has a normal mood and affect.  EFM: 145 bpm, mod variability, + accels, no decels Toco: 4-5  Results for orders placed or performed during the hospital encounter of 04/18/19 (from the past 24 hour(s))  Urinalysis, Routine w reflex microscopic     Status: Abnormal   Collection Time: 04/18/19  5:51 PM  Result Value Ref Range   Color, Urine YELLOW YELLOW   APPearance HAZY (A) CLEAR   Specific Gravity, Urine 1.012 1.005 - 1.030   pH 6.0 5.0 - 8.0   Glucose, UA 50 (A) NEGATIVE mg/dL   Hgb urine dipstick NEGATIVE NEGATIVE   Bilirubin Urine NEGATIVE NEGATIVE   Ketones, ur 80 (A) NEGATIVE mg/dL   Protein, ur NEGATIVE NEGATIVE mg/dL   Nitrite NEGATIVE NEGATIVE   Leukocytes,Ua NEGATIVE NEGATIVE  Protein / creatinine ratio, urine     Status: Abnormal   Collection Time: 04/18/19  6:22 PM  Result Value Ref Range   Creatinine, Urine 126.60 mg/dL   Total Protein, Urine 22 mg/dL   Protein Creatinine Ratio 0.17 (H) 0.00 - 0.15 mg/mg[Cre]  CBC     Status: None   Collection Time: 04/18/19  6:47 PM  Result Value Ref Range   WBC 7.0 4.0 - 10.5 K/uL   RBC 4.34 3.87 - 5.11 MIL/uL   Hemoglobin 13.2 12.0 - 15.0 g/dL   HCT 38.5 36.0 - 46.0 %   MCV 88.7 80.0 - 100.0 fL   MCH 30.4 26.0 - 34.0 pg   MCHC 34.3 30.0 - 36.0 g/dL   RDW 13.2 11.5 - 15.5 %   Platelets 222 150 - 400 K/uL   nRBC 0.0 0.0 - 0.2 %  Comprehensive metabolic panel     Status: Abnormal   Collection Time: 04/18/19  6:47 PM  Result Value Ref Range   Sodium 134 (L) 135 - 145 mmol/L   Potassium 2.9 (L) 3.5 - 5.1 mmol/L   Chloride 103 98 - 111 mmol/L   CO2 19 (L) 22 - 32 mmol/L   Glucose, Bld 76 70 - 99 mg/dL   BUN <5 (L) 6 - 20 mg/dL   Creatinine, Ser 0.61 0.44 - 1.00 mg/dL   Calcium 8.9 8.9 -  10.3 mg/dL   Total  Protein 6.8 6.5 - 8.1 g/dL   Albumin 3.0 (L) 3.5 - 5.0 g/dL   AST 26 15 - 41 U/L   ALT 25 0 - 44 U/L   Alkaline Phosphatase 304 (H) 38 - 126 U/L   Total Bilirubin 0.7 0.3 - 1.2 mg/dL   GFR calc non Af Amer >60 >60 mL/min   GFR calc Af Amer >60 >60 mL/min   Anion gap 12 5 - 15   MAU Course  Procedures Po hydration Flexeril  MDM Labs ordered and reviewed. Back pain and ctx improved. Cervix rechecked and no change. No evidence of PTL or UTI. Dehydration noted on UA, could be cause of ctx and/or recent trich infection. Hypokalemia noted, will treat with po K. PEC labs neg. Pt feeling good FM now, NST reactive. Stable for discharge home.   Assessment and Plan   1. [redacted] weeks gestation of pregnancy   2. Supervision of high risk pregnancy, antepartum   3. NST (non-stress test) reactive   4. Preterm uterine contractions in third trimester, antepartum   5. Hypokalemia   6. Decreased fetal movements in third trimester, single or unspecified fetus    Discharge home Follow up at [facility] next week as scheduled PTL precautions FMCs Rx KDur  Allergies as of 04/18/2019      Reactions   Makena [hydroxyprogesterone Caproate] Hives      Medication List    TAKE these medications   chlorproMAZINE 25 MG tablet Commonly known as: THORAZINE Take 25 mg by mouth 2 (two) times daily.   CitraNatal B-Calm 20-1 MG & 2 x 25 MG Misc Take by mouth.   cyclobenzaprine 10 MG tablet Commonly known as: FLEXERIL Take 10 mg by mouth 3 (three) times daily as needed for muscle spasms.   Diclegis 10-10 MG Tbec Generic drug: Doxylamine-Pyridoxine Take by mouth.   labetalol 100 MG tablet Commonly known as: NORMODYNE Take 100 mg by mouth 2 (two) times daily.   omeprazole 20 MG capsule Commonly known as: PRILOSEC Take 40 mg by mouth daily.   ondansetron 4 MG disintegrating tablet Commonly known as: ZOFRAN-ODT Take 4 mg by mouth every 8 (eight) hours as needed for nausea or vomiting.   PHENERGAN  PO Take by mouth.   potassium chloride SA 20 MEQ tablet Commonly known as: KLOR-CON Take 1 tablet (20 mEq total) by mouth 2 (two) times daily for 7 days.       Donette Larry, CNM 04/18/2019, 8:23 PM

## 2019-04-18 NOTE — ED Provider Notes (Signed)
MSE was initiated and I personally evaluated the patient and placed orders (if any) at  5:34 PM on April 18, 2019.  The patient appears stable so that the remainder of the MSE may be completed by another provider.  Patient presents at [redacted] weeks pregnant with complaint of contractions. Patient denies bleeding or leaking fluids, denies urge to push. Patient is alert, not in distress, may be transported to MAU for further evaluation.    Jeannie Fend, PA-C 04/18/19 2212    Tilden Fossa, MD 04/19/19 1438

## 2019-04-18 NOTE — Discharge Instructions (Signed)
Fetal Movement Counts Patient Name: ________________________________________________ Patient Due Date: ____________________ What is a fetal movement count?  A fetal movement count is the number of times that you feel your baby move during a certain amount of time. This may also be called a fetal kick count. A fetal movement count is recommended for every pregnant woman. You may be asked to start counting fetal movements as early as week 28 of your pregnancy. Pay attention to when your baby is most active. You may notice your baby's sleep and wake cycles. You may also notice things that make your baby move more. You should do a fetal movement count:  When your baby is normally most active.  At the same time each day. A good time to count movements is while you are resting, after having something to eat and drink. How do I count fetal movements? 1. Find a quiet, comfortable area. Sit, or lie down on your side. 2. Write down the date, the start time and stop time, and the number of movements that you felt between those two times. Take this information with you to your health care visits. 3. Write down your start time when you feel the first movement. 4. Count kicks, flutters, swishes, rolls, and jabs. You should feel at least 10 movements. 5. You may stop counting after you have felt 10 movements, or if you have been counting for 2 hours. Write down the stop time. 6. If you do not feel 10 movements in 2 hours, contact your health care provider for further instructions. Your health care provider may want to do additional tests to assess your baby's well-being. Contact a health care provider if:  You feel fewer than 10 movements in 2 hours.  Your baby is not moving like he or she usually does. Date: ____________ Start time: ____________ Stop time: ____________ Movements: ____________ Date: ____________ Start time: ____________ Stop time: ____________ Movements: ____________ Date: ____________  Start time: ____________ Stop time: ____________ Movements: ____________ Date: ____________ Start time: ____________ Stop time: ____________ Movements: ____________ Date: ____________ Start time: ____________ Stop time: ____________ Movements: ____________ Date: ____________ Start time: ____________ Stop time: ____________ Movements: ____________ Date: ____________ Start time: ____________ Stop time: ____________ Movements: ____________ Date: ____________ Start time: ____________ Stop time: ____________ Movements: ____________ Date: ____________ Start time: ____________ Stop time: ____________ Movements: ____________ This information is not intended to replace advice given to you by your health care provider. Make sure you discuss any questions you have with your health care provider. Document Revised: 11/07/2018 Document Reviewed: 11/07/2018 Elsevier Patient Education  2020 Elsevier Inc.   [employer] of the uterus can occur throughout pregnancy, but they are not always a sign that you are in labor. You may have practice contractions called Braxton Hicks contractions. These false labor contractions are sometimes confused with true labor. What are Deberah Pelton contractions? Braxton Hicks contractions are tightening movements that occur in the muscles of the uterus before labor. Unlike true labor contractions, these contractions do not result in opening (dilation) and thinning of the cervix. Toward the end of pregnancy (32-34 weeks), Braxton Hicks contractions can happen more often and may become stronger. These contractions are sometimes difficult to tell apart from true labor because they can be very uncomfortable. You should not feel embarrassed if you go to the hospital with false labor. Sometimes, the only way to tell if you are in true labor is for your health care provider to look for changes in the cervix. The health  care provider will do a physical exam and may  monitor your contractions. If you are not in true labor, the exam should show that your cervix is not dilating and your water has not broken. If there are no other health problems associated with your pregnancy, it is completely safe for you to be sent home with false labor. You may continue to have Braxton Hicks contractions until you go into true labor. How to tell the difference between true labor and false labor True labor  Contractions last 30-70 seconds.  Contractions become very regular.  Discomfort is usually felt in the top of the uterus, and it spreads to the lower abdomen and low back.  Contractions do not go away with walking.  Contractions usually become more intense and increase in frequency.  The cervix dilates and gets thinner. False labor  Contractions are usually shorter and not as strong as true labor contractions.  Contractions are usually irregular.  Contractions are often felt in the front of the lower abdomen and in the groin.  Contractions may go away when you walk around or change positions while lying down.  Contractions get weaker and are shorter-lasting as time goes on.  The cervix usually does not dilate or become thin. Follow these instructions at home:   Take over-the-counter and prescription medicines only as told by your health care provider.  Keep up with your usual exercises and follow other instructions from your health care provider.  Eat and drink lightly if you think you are going into labor.  If Braxton Hicks contractions are making you uncomfortable: ? Change your position from lying down or resting to walking, or change from walking to resting. ? Sit and rest in a tub of warm water. ? Drink enough fluid to keep your urine pale yellow. Dehydration may cause these contractions. ? Do slow and deep breathing several times an hour.  Keep all follow-up prenatal visits as told by your health care provider. This is important. Contact a  health care provider if:  You have a fever.  You have continuous pain in your abdomen. Get help right away if:  Your contractions become stronger, more regular, and closer together.  You have fluid leaking or gushing from your vagina.  You pass blood-tinged mucus (bloody show).  You have bleeding from your vagina.  You have low back pain that you never had before.  You feel your baby's head pushing down and causing pelvic pressure.  Your baby is not moving inside you as much as it used to. Summary  Contractions that occur before labor are called Braxton Hicks contractions, false labor, or practice contractions.  Braxton Hicks contractions are usually shorter, weaker, farther apart, and less regular than true labor contractions. True labor contractions usually become progressively stronger and regular, and they become more frequent.  Manage discomfort from [facility] contractions by changing position, resting in a warm bath, drinking plenty of water, or practicing deep breathing. This information is not intended to replace advice given to you by your health care provider. Make sure you discuss any questions you have with your health care provider. Document Revised: 03/02/2017 Document Reviewed: 08/03/2016 Elsevier Patient Education  [place].   Hypokalemia Hypokalemia means that the amount of potassium in the blood is lower than normal. Potassium is a chemical (electrolyte) that helps regulate the amount of fluid in the body. It also stimulates muscle tightening (contraction) and helps nerves work properly. Normally, most of the body's potassium is  inside cells, and only a very small amount is in the blood. Because the amount in the blood is so small, minor changes to potassium levels in the blood can be life-threatening. What are the causes? This condition may be caused by:  Antibiotic medicine.  Diarrhea or vomiting. Taking too much of a medicine that helps you  have a bowel movement (laxative) can cause diarrhea and lead to hypokalemia.  Chronic kidney disease (CKD).  Medicines that help the body get rid of excess fluid (diuretics).  Eating disorders, such as bulimia.  Low magnesium levels in the body.  Sweating a lot. What are the signs or symptoms? Symptoms of this condition include:  Weakness.  Constipation.  Fatigue.  Muscle cramps.  Mental confusion.  Skipped heartbeats or irregular heartbeat (palpitations).  Tingling or numbness. How is this diagnosed? This condition is diagnosed with a blood test. How is this treated? This condition may be treated by:  Taking potassium supplements by mouth.  Adjusting the medicines that you take.  Eating more foods that contain a lot of potassium. If your potassium level is very low, you may need to get potassium through an IV and be monitored in the hospital. Follow these instructions at home:   Take over-the-counter and prescription medicines only as told by your health care provider. This includes vitamins and supplements.  Eat a healthy diet. A healthy diet includes fresh fruits and vegetables, whole grains, healthy fats, and lean proteins.  If instructed, eat more foods that contain a lot of potassium. This includes: ? Nuts, such as peanuts and pistachios. ? Seeds, such as sunflower seeds and pumpkin seeds. ? Peas, lentils, and lima beans. ? Whole grain and bran cereals and breads. ? Fresh fruits and vegetables, such as apricots, avocado, bananas, cantaloupe, kiwi, oranges, tomatoes, asparagus, and potatoes. ? Orange juice. ? Tomato juice. ? Red meats. ? Yogurt.  Keep all follow-up visits as told by your health care provider. This is important. Contact a health care provider if you:  Have weakness that gets worse.  Feel your heart pounding or racing.  Vomit.  Have diarrhea.  Have diabetes (diabetes mellitus) and you have trouble keeping your blood sugar  (glucose) in your target range. Get help right away if you:  Have chest pain.  Have shortness of breath.  Have vomiting or diarrhea that lasts for more than 2 days.  Faint. Summary  Hypokalemia means that the amount of potassium in the blood is lower than normal.  This condition is diagnosed with a blood test.  Hypokalemia may be treated by taking potassium supplements, adjusting the medicines that you take, or eating more foods that are high in potassium.  If your potassium level is very low, you may need to get potassium through an IV and be monitored in the hospital. This information is not intended to replace advice given to you by your health care provider. Make sure you discuss any questions you have with your health care provider. Document Revised: 10/31/2017 Document Reviewed: 10/31/2017 Elsevier Patient Education  2020 [employer].

## 2019-04-18 NOTE — MAU Note (Signed)
.   [name] is a 34 y.o. at [redacted]w[redacted]d here in MAU reporting: ctx starting last night that continued throughout the day. They are increasing in intensity. No VB or LOF. Patient reports decreased fetal movement since this morning. No recent intercourse. She states she was 3 cm in the office yesterday. Pain score: 10 Vitals:   04/18/19 1730 04/18/19 1753  BP:  (!) 148/93  Pulse:  (!) 114  Resp: 16 17  Temp:  99 F (37.2 C)  SpO2:  100%     FHT:146 Lab orders placed from triage:

## 2019-04-19 LAB — STREP GP B NAA: Strep Gp B NAA: POSITIVE — AB

## 2019-04-21 ENCOUNTER — Telehealth: Payer: Self-pay | Admitting: *Deleted

## 2019-04-21 NOTE — Telephone Encounter (Signed)
Called pt to inform her that she needs to re-sign BTS form from 04/17/19. Pt voiced understanding and agreed to sign form tomorrow (1/19) when she comes to MFM for scheduled [place] appt.

## 2019-04-22 ENCOUNTER — Ambulatory Visit (HOSPITAL_COMMUNITY)
Admission: RE | Admit: 2019-04-22 | Discharge: 2019-04-22 | Disposition: A | Discharge status: Home / Self Care | Payer: Medicaid Other | Source: Ambulatory Visit | Attending: Obstetrics and Gynecology | Admitting: Obstetrics and Gynecology

## 2019-04-22 ENCOUNTER — Ambulatory Visit (HOSPITAL_COMMUNITY): Payer: Medicaid Other | Admitting: *Deleted

## 2019-04-22 ENCOUNTER — Other Ambulatory Visit: Payer: Self-pay

## 2019-04-22 ENCOUNTER — Ambulatory Visit (HOSPITAL_COMMUNITY): Admission: RE | Admit: 2019-04-22 | Payer: Medicaid Other | Source: Ambulatory Visit

## 2019-04-22 ENCOUNTER — Encounter (HOSPITAL_COMMUNITY): Payer: Self-pay | Admitting: *Deleted

## 2019-04-22 ENCOUNTER — Telehealth (HOSPITAL_COMMUNITY): Payer: Self-pay | Admitting: *Deleted

## 2019-04-22 ENCOUNTER — Encounter: Payer: Self-pay | Admitting: *Deleted

## 2019-04-22 DIAGNOSIS — O365930 Maternal care for other known or suspected poor fetal growth, third trimester, not applicable or unspecified: Secondary | ICD-10-CM

## 2019-04-22 DIAGNOSIS — O43193 Other malformation of placenta, third trimester: Secondary | ICD-10-CM

## 2019-04-22 DIAGNOSIS — O34219 Maternal care for unspecified type scar from previous cesarean delivery: Secondary | ICD-10-CM

## 2019-04-22 DIAGNOSIS — O0990 Supervision of high risk pregnancy, unspecified, unspecified trimester: Secondary | ICD-10-CM

## 2019-04-22 DIAGNOSIS — O09213 Supervision of pregnancy with history of pre-term labor, third trimester: Secondary | ICD-10-CM

## 2019-04-22 DIAGNOSIS — O09293 Supervision of pregnancy with other poor reproductive or obstetric history, third trimester: Secondary | ICD-10-CM

## 2019-04-22 DIAGNOSIS — O133 Gestational [pregnancy-induced] hypertension without significant proteinuria, third trimester: Secondary | ICD-10-CM

## 2019-04-22 DIAGNOSIS — O24419 Gestational diabetes mellitus in pregnancy, unspecified control: Secondary | ICD-10-CM

## 2019-04-22 DIAGNOSIS — Z3A35 35 weeks gestation of pregnancy: Secondary | ICD-10-CM

## 2019-04-22 DIAGNOSIS — Z362 Encounter for other antenatal screening follow-up: Secondary | ICD-10-CM | POA: Diagnosis not present

## 2019-04-22 DIAGNOSIS — O365990 Maternal care for other known or suspected poor fetal growth, unspecified trimester, not applicable or unspecified: Secondary | ICD-10-CM | POA: Insufficient documentation

## 2019-04-22 NOTE — Telephone Encounter (Signed)
Preadmission screen  

## 2019-04-23 ENCOUNTER — Encounter: Payer: Self-pay | Admitting: *Deleted

## 2019-04-25 ENCOUNTER — Ambulatory Visit (INDEPENDENT_AMBULATORY_CARE_PROVIDER_SITE_OTHER): Payer: Medicaid Other | Admitting: Obstetrics & Gynecology

## 2019-04-25 ENCOUNTER — Other Ambulatory Visit: Payer: Self-pay

## 2019-04-25 VITALS — BP 150/84 | HR 110 | Wt 145.1 lb

## 2019-04-25 DIAGNOSIS — O0993 Supervision of high risk pregnancy, unspecified, third trimester: Secondary | ICD-10-CM

## 2019-04-25 DIAGNOSIS — O34219 Maternal care for unspecified type scar from previous cesarean delivery: Secondary | ICD-10-CM | POA: Diagnosis not present

## 2019-04-25 DIAGNOSIS — O365930 Maternal care for other known or suspected poor fetal growth, third trimester, not applicable or unspecified: Secondary | ICD-10-CM | POA: Diagnosis not present

## 2019-04-25 DIAGNOSIS — O133 Gestational [pregnancy-induced] hypertension without significant proteinuria, third trimester: Secondary | ICD-10-CM | POA: Diagnosis not present

## 2019-04-25 DIAGNOSIS — O139 Gestational [pregnancy-induced] hypertension without significant proteinuria, unspecified trimester: Secondary | ICD-10-CM

## 2019-04-25 DIAGNOSIS — Z3A36 36 weeks gestation of pregnancy: Secondary | ICD-10-CM

## 2019-04-25 DIAGNOSIS — O0990 Supervision of high risk pregnancy, unspecified, unspecified trimester: Secondary | ICD-10-CM

## 2019-04-25 NOTE — Patient Instructions (Signed)

## 2019-04-25 NOTE — Progress Notes (Signed)
   PRENATAL VISIT NOTE  Subjective:  Regina Dawson is a 34 y.o. [phone] at [redacted]w[redacted]d being seen today for ongoing prenatal care.  She is currently monitored for the following issues for this high-risk pregnancy and has Supervision of high risk pregnancy, antepartum; History of preterm delivery; History of cesarean delivery affecting pregnancy; Hx of preeclampsia, prior pregnancy, currently pregnant; Gestational hypertension, antepartum; and IUGR (intrauterine growth restriction) affecting care of mother on their problem list.  Patient reports no complaints.  Contractions: Irritability. Vag. Bleeding: None.  Movement: Present. Denies leaking of fluid.   The following portions of the patient's history were reviewed and updated as appropriate: allergies, current medications, past family history, past medical history, past social history, past surgical history and problem list.   Objective:   Vitals:   04/25/19 0955  BP: (!) 152/87  Pulse: (!) 118  Weight: 145 lb 1.6 oz (65.8 kg)    Fetal Status: Fetal Heart Rate (bpm): 164   Movement: Present     General:  Alert, oriented and cooperative. Patient is in no acute distress.  Skin: Skin is warm and dry. No rash noted.   Cardiovascular: Normal heart rate noted  Respiratory: Normal respiratory effort, no problems with respiration noted  Abdomen: Soft, gravid, appropriate for gestational age.  Pain/Pressure: Present     Pelvic: Cervical exam deferred        Extremities: Normal range of motion.  Edema: Trace  Mental Status: Normal mood and affect. Normal behavior. Normal judgment and thought content.   Assessment and Plan:  Pregnancy: [id number] at [redacted]w[redacted]d 1. Supervision of high risk pregnancy, antepartum Transferred from Dr. Shawnie Pons  2. Gestational hypertension, antepartum On Labetalol missed her dose this AM, continue her medication  3. History of cesarean delivery affecting pregnancy TOLAC, IOL 1 week  4. Poor fetal growth affecting  management of mother in third trimester, single or unspecified fetus IOL 37 weeks, nl doppler and BPP 8/8 1/19  Preterm labor symptoms and general obstetric precautions including but not limited to vaginal bleeding, contractions, leaking of fluid and fetal movement were reviewed in detail with the patient. Please refer to After Visit Summary for other counseling recommendations.   Return if symptoms worsen or fail to improve, for postpartum.  Future Appointments  Date Time Provider Department Center  04/29/2019 10:15 AM WH-MFC NURSE WH-MFC MFC-US  04/29/2019 10:15 AM WH-MFC [place] 4 WH-MFCUS MFC-US  04/30/2019  9:05 AM MC-SCREENING MC-SDSC None  05/02/2019  7:00 AM MC-LD SCHED ROOM MC-INDC None    Scheryl Darter, MD

## 2019-04-27 ENCOUNTER — Other Ambulatory Visit: Payer: Self-pay | Admitting: Family Medicine

## 2019-04-27 DIAGNOSIS — O0990 Supervision of high risk pregnancy, unspecified, unspecified trimester: Secondary | ICD-10-CM

## 2019-04-29 ENCOUNTER — Encounter (HOSPITAL_COMMUNITY): Payer: Self-pay

## 2019-04-29 ENCOUNTER — Other Ambulatory Visit: Payer: Self-pay

## 2019-04-29 ENCOUNTER — Ambulatory Visit (HOSPITAL_COMMUNITY): Payer: Medicaid Other | Admitting: *Deleted

## 2019-04-29 ENCOUNTER — Ambulatory Visit (HOSPITAL_COMMUNITY)
Admission: RE | Admit: 2019-04-29 | Discharge: 2019-04-29 | Disposition: A | Discharge status: Home / Self Care | Payer: Medicaid Other | Source: Ambulatory Visit | Attending: Obstetrics and Gynecology | Admitting: Obstetrics and Gynecology

## 2019-04-29 ENCOUNTER — Ambulatory Visit (HOSPITAL_COMMUNITY): Payer: Medicaid Other

## 2019-04-29 VITALS — BP 133/89 | HR 112 | Temp 97.9°F

## 2019-04-29 DIAGNOSIS — O365930 Maternal care for other known or suspected poor fetal growth, third trimester, not applicable or unspecified: Secondary | ICD-10-CM

## 2019-04-29 DIAGNOSIS — O24419 Gestational diabetes mellitus in pregnancy, unspecified control: Secondary | ICD-10-CM

## 2019-04-29 DIAGNOSIS — O133 Gestational [pregnancy-induced] hypertension without significant proteinuria, third trimester: Secondary | ICD-10-CM | POA: Diagnosis not present

## 2019-04-29 DIAGNOSIS — O365990 Maternal care for other known or suspected poor fetal growth, unspecified trimester, not applicable or unspecified: Secondary | ICD-10-CM | POA: Insufficient documentation

## 2019-04-29 DIAGNOSIS — O09293 Supervision of pregnancy with other poor reproductive or obstetric history, third trimester: Secondary | ICD-10-CM

## 2019-04-29 DIAGNOSIS — O09213 Supervision of pregnancy with history of pre-term labor, third trimester: Secondary | ICD-10-CM | POA: Diagnosis not present

## 2019-04-29 DIAGNOSIS — Z3A36 36 weeks gestation of pregnancy: Secondary | ICD-10-CM

## 2019-04-29 DIAGNOSIS — O43193 Other malformation of placenta, third trimester: Secondary | ICD-10-CM

## 2019-04-29 DIAGNOSIS — O0990 Supervision of high risk pregnancy, unspecified, unspecified trimester: Secondary | ICD-10-CM | POA: Insufficient documentation

## 2019-04-29 DIAGNOSIS — O34219 Maternal care for unspecified type scar from previous cesarean delivery: Secondary | ICD-10-CM

## 2019-04-29 NOTE — Progress Notes (Signed)
Pt reports having "wet panties" this morning and her blankets were soaked. No leaking since this episode. +FM, denies pain or bleeding.

## 2019-04-30 ENCOUNTER — Other Ambulatory Visit (HOSPITAL_COMMUNITY)
Admission: RE | Admit: 2019-04-30 | Discharge: 2019-04-30 | Disposition: A | Discharge status: Home / Self Care | Payer: Medicaid Other | Source: Ambulatory Visit | Attending: Family Medicine | Admitting: Family Medicine

## 2019-04-30 ENCOUNTER — Other Ambulatory Visit: Payer: Self-pay | Admitting: Advanced Practice Midwife

## 2019-04-30 DIAGNOSIS — Z20822 Contact with and (suspected) exposure to covid-19: Secondary | ICD-10-CM | POA: Diagnosis not present

## 2019-04-30 DIAGNOSIS — Z01812 Encounter for preprocedural laboratory examination: Secondary | ICD-10-CM | POA: Insufficient documentation

## 2019-04-30 LAB — SARS CORONAVIRUS 2 (TAT 6-24 HRS): SARS Coronavirus 2: NEGATIVE

## 2019-05-02 ENCOUNTER — Other Ambulatory Visit: Payer: Self-pay

## 2019-05-02 ENCOUNTER — Inpatient Hospital Stay (HOSPITAL_COMMUNITY): Payer: Medicaid Other | Admitting: Anesthesiology

## 2019-05-02 ENCOUNTER — Encounter (HOSPITAL_COMMUNITY): Payer: Self-pay | Admitting: Obstetrics and Gynecology

## 2019-05-02 ENCOUNTER — Inpatient Hospital Stay (HOSPITAL_COMMUNITY)
Admission: AD | Admit: 2019-05-02 | Discharge: 2019-05-04 | DRG: 807 | Disposition: A | Discharge status: Home / Self Care | Payer: Medicaid Other | Attending: Family Medicine | Admitting: Family Medicine

## 2019-05-02 ENCOUNTER — Inpatient Hospital Stay (HOSPITAL_COMMUNITY): Payer: Medicaid Other

## 2019-05-02 DIAGNOSIS — O34219 Maternal care for unspecified type scar from previous cesarean delivery: Secondary | ICD-10-CM | POA: Diagnosis present

## 2019-05-02 DIAGNOSIS — O99824 Streptococcus B carrier state complicating childbirth: Secondary | ICD-10-CM | POA: Diagnosis present

## 2019-05-02 DIAGNOSIS — O43123 Velamentous insertion of umbilical cord, third trimester: Secondary | ICD-10-CM | POA: Diagnosis present

## 2019-05-02 DIAGNOSIS — O365930 Maternal care for other known or suspected poor fetal growth, third trimester, not applicable or unspecified: Secondary | ICD-10-CM | POA: Diagnosis present

## 2019-05-02 DIAGNOSIS — O99334 Smoking (tobacco) complicating childbirth: Secondary | ICD-10-CM | POA: Diagnosis present

## 2019-05-02 DIAGNOSIS — O134 Gestational [pregnancy-induced] hypertension without significant proteinuria, complicating childbirth: Secondary | ICD-10-CM | POA: Diagnosis present

## 2019-05-02 DIAGNOSIS — O365990 Maternal care for other known or suspected poor fetal growth, unspecified trimester, not applicable or unspecified: Secondary | ICD-10-CM | POA: Diagnosis present

## 2019-05-02 DIAGNOSIS — O1002 Pre-existing essential hypertension complicating childbirth: Principal | ICD-10-CM | POA: Diagnosis present

## 2019-05-02 DIAGNOSIS — Z3A37 37 weeks gestation of pregnancy: Secondary | ICD-10-CM

## 2019-05-02 DIAGNOSIS — F17210 Nicotine dependence, cigarettes, uncomplicated: Secondary | ICD-10-CM | POA: Diagnosis present

## 2019-05-02 DIAGNOSIS — O0990 Supervision of high risk pregnancy, unspecified, unspecified trimester: Secondary | ICD-10-CM

## 2019-05-02 DIAGNOSIS — Z8751 Personal history of pre-term labor: Secondary | ICD-10-CM

## 2019-05-02 DIAGNOSIS — O139 Gestational [pregnancy-induced] hypertension without significant proteinuria, unspecified trimester: Secondary | ICD-10-CM | POA: Diagnosis present

## 2019-05-02 DIAGNOSIS — O09299 Supervision of pregnancy with other poor reproductive or obstetric history, unspecified trimester: Secondary | ICD-10-CM

## 2019-05-02 LAB — TYPE AND SCREEN
ABO/RH(D): O POS
Antibody Screen: NEGATIVE

## 2019-05-02 LAB — CBC
HCT: 39 % (ref 36.0–46.0)
Hemoglobin: 13 g/dL (ref 12.0–15.0)
MCH: 30 pg (ref 26.0–34.0)
MCHC: 33.3 g/dL (ref 30.0–36.0)
MCV: 89.9 fL (ref 80.0–100.0)
Platelets: 239 10*3/uL (ref 150–400)
RBC: 4.34 MIL/uL (ref 3.87–5.11)
RDW: 13.1 % (ref 11.5–15.5)
WBC: 5.8 10*3/uL (ref 4.0–10.5)
nRBC: 0 % (ref 0.0–0.2)

## 2019-05-02 LAB — RPR: RPR Ser Ql: NONREACTIVE

## 2019-05-02 MED ORDER — EPHEDRINE 5 MG/ML INJ: 10.0000 mg | INTRAVENOUS | Status: DC | PRN

## 2019-05-02 MED ORDER — DIPHENHYDRAMINE HCL 25 MG PO CAPS: 25.0000 mg | ORAL_CAPSULE | Freq: Four times a day (QID) | ORAL | Status: DC | PRN

## 2019-05-02 MED ORDER — LABETALOL HCL 100 MG PO TABS
100.0000 mg | ORAL_TABLET | Freq: Two times a day (BID) | ORAL | Status: DC
  Administered 2019-05-02 (×2): 100 mg via ORAL
  Filled 2019-05-02 (×2): qty 1

## 2019-05-02 MED ORDER — FENTANYL-BUPIVACAINE-NACL 0.5-0.125-0.9 MG/250ML-% EP SOLN
EPIDURAL | Status: AC
  Filled 2019-05-02: qty 250

## 2019-05-02 MED ORDER — LACTATED RINGERS IV SOLN: 500.0000 mL | Freq: Once | INTRAVENOUS | Status: DC

## 2019-05-02 MED ORDER — FENTANYL CITRATE (PF) 100 MCG/2ML IJ SOLN
50.0000 ug | INTRAMUSCULAR | Status: DC | PRN
  Administered 2019-05-02 (×4): 100 ug via INTRAVENOUS
  Filled 2019-05-02 (×4): qty 2

## 2019-05-02 MED ORDER — SODIUM CHLORIDE 0.9 % IV SOLN
5.0000 10*6.[IU] | Freq: Once | INTRAVENOUS | Status: AC
  Administered 2019-05-02: 5 10*6.[IU] via INTRAVENOUS
  Filled 2019-05-02: qty 5

## 2019-05-02 MED ORDER — DIBUCAINE (PERIANAL) 1 % EX OINT: 1.0000 "application " | TOPICAL_OINTMENT | CUTANEOUS | Status: DC | PRN

## 2019-05-02 MED ORDER — PRENATAL MULTIVITAMIN CH
1.0000 | ORAL_TABLET | Freq: Every day | ORAL | Status: DC
  Administered 2019-05-03 – 2019-05-04 (×2): 1 via ORAL
  Filled 2019-05-02 (×2): qty 1

## 2019-05-02 MED ORDER — PROMETHAZINE HCL 25 MG/ML IJ SOLN
12.5000 mg | Freq: Once | INTRAMUSCULAR | Status: AC
  Administered 2019-05-02: 12.5 mg via INTRAVENOUS
  Filled 2019-05-02: qty 1

## 2019-05-02 MED ORDER — OXYTOCIN 40 UNITS IN NORMAL SALINE INFUSION - SIMPLE MED
2.5000 [IU]/h | INTRAVENOUS | Status: DC
  Filled 2019-05-02: qty 1000

## 2019-05-02 MED ORDER — OXYTOCIN 40 UNITS IN NORMAL SALINE INFUSION - SIMPLE MED
1.0000 m[IU]/min | INTRAVENOUS | Status: DC
  Administered 2019-05-02: 1 m[IU]/min via INTRAVENOUS

## 2019-05-02 MED ORDER — ONDANSETRON HCL 4 MG/2ML IJ SOLN: 4.0000 mg | INTRAMUSCULAR | Status: DC | PRN

## 2019-05-02 MED ORDER — BENZOCAINE-MENTHOL 20-0.5 % EX AERO: 1.0000 "application " | INHALATION_SPRAY | CUTANEOUS | Status: DC | PRN

## 2019-05-02 MED ORDER — PHENYLEPHRINE 40 MCG/ML (10ML) SYRINGE FOR IV PUSH (FOR BLOOD PRESSURE SUPPORT)
PREFILLED_SYRINGE | INTRAVENOUS | Status: AC
  Filled 2019-05-02: qty 10

## 2019-05-02 MED ORDER — TETANUS-DIPHTH-ACELL PERTUSSIS 5-2.5-18.5 LF-MCG/0.5 IM SUSP: 0.5000 mL | Freq: Once | INTRAMUSCULAR | Status: DC

## 2019-05-02 MED ORDER — DIPHENHYDRAMINE HCL 50 MG/ML IJ SOLN: 12.5000 mg | INTRAMUSCULAR | Status: DC | PRN

## 2019-05-02 MED ORDER — MEDROXYPROGESTERONE ACETATE 150 MG/ML IM SUSP: 150.0000 mg | INTRAMUSCULAR | Status: DC | PRN

## 2019-05-02 MED ORDER — ACETAMINOPHEN 325 MG PO TABS
650.0000 mg | ORAL_TABLET | ORAL | Status: DC | PRN
  Administered 2019-05-02 – 2019-05-03 (×3): 650 mg via ORAL
  Filled 2019-05-02 (×3): qty 2

## 2019-05-02 MED ORDER — PHENYLEPHRINE 40 MCG/ML (10ML) SYRINGE FOR IV PUSH (FOR BLOOD PRESSURE SUPPORT): 80.0000 ug | PREFILLED_SYRINGE | INTRAVENOUS | Status: DC | PRN

## 2019-05-02 MED ORDER — SIMETHICONE 80 MG PO CHEW: 80.0000 mg | CHEWABLE_TABLET | ORAL | Status: DC | PRN

## 2019-05-02 MED ORDER — COCONUT OIL OIL: 1.0000 "application " | TOPICAL_OIL | Status: DC | PRN

## 2019-05-02 MED ORDER — ZOLPIDEM TARTRATE 5 MG PO TABS: 5.0000 mg | ORAL_TABLET | Freq: Every evening | ORAL | Status: DC | PRN

## 2019-05-02 MED ORDER — ONDANSETRON HCL 4 MG/2ML IJ SOLN: 4.0000 mg | Freq: Four times a day (QID) | INTRAMUSCULAR | Status: DC | PRN

## 2019-05-02 MED ORDER — CEFAZOLIN SODIUM-DEXTROSE 2-4 GM/100ML-% IV SOLN
2.0000 g | Freq: Three times a day (TID) | INTRAVENOUS | Status: AC
  Administered 2019-05-02: 2 g via INTRAVENOUS
  Filled 2019-05-02: qty 100

## 2019-05-02 MED ORDER — TRANEXAMIC ACID-NACL 1000-0.7 MG/100ML-% IV SOLN
1000.0000 mg | Freq: Once | INTRAVENOUS | Status: DC
  Filled 2019-05-02: qty 100

## 2019-05-02 MED ORDER — IBUPROFEN 600 MG PO TABS
600.0000 mg | ORAL_TABLET | Freq: Four times a day (QID) | ORAL | Status: DC
  Administered 2019-05-02 – 2019-05-04 (×7): 600 mg via ORAL
  Filled 2019-05-02 (×7): qty 1

## 2019-05-02 MED ORDER — LIDOCAINE HCL (PF) 1 % IJ SOLN: 30.0000 mL | INTRAMUSCULAR | Status: DC | PRN

## 2019-05-02 MED ORDER — ACETAMINOPHEN 325 MG PO TABS: 650.0000 mg | ORAL_TABLET | ORAL | Status: DC | PRN

## 2019-05-02 MED ORDER — LACTATED RINGERS IV SOLN: 500.0000 mL | INTRAVENOUS | Status: DC | PRN

## 2019-05-02 MED ORDER — OXYCODONE HCL 5 MG PO TABS
5.0000 mg | ORAL_TABLET | ORAL | Status: DC | PRN
  Administered 2019-05-03 (×2): 5 mg via ORAL
  Filled 2019-05-02 (×2): qty 1

## 2019-05-02 MED ORDER — BUTORPHANOL TARTRATE 1 MG/ML IJ SOLN
1.0000 mg | INTRAMUSCULAR | Status: DC | PRN
  Administered 2019-05-02: 1 mg via INTRAVENOUS
  Filled 2019-05-02: qty 1

## 2019-05-02 MED ORDER — LACTATED RINGERS IV SOLN: INTRAVENOUS | Status: DC

## 2019-05-02 MED ORDER — OXYTOCIN BOLUS FROM INFUSION
500.0000 mL | Freq: Once | INTRAVENOUS | Status: AC
  Administered 2019-05-02: 500 mL via INTRAVENOUS

## 2019-05-02 MED ORDER — WITCH HAZEL-GLYCERIN EX PADS: 1.0000 "application " | MEDICATED_PAD | CUTANEOUS | Status: DC | PRN

## 2019-05-02 MED ORDER — OXYCODONE HCL 5 MG PO TABS: 10.0000 mg | ORAL_TABLET | ORAL | Status: DC | PRN

## 2019-05-02 MED ORDER — TERBUTALINE SULFATE 1 MG/ML IJ SOLN: 0.2500 mg | Freq: Once | INTRAMUSCULAR | Status: DC | PRN

## 2019-05-02 MED ORDER — ONDANSETRON HCL 4 MG PO TABS: 4.0000 mg | ORAL_TABLET | ORAL | Status: DC | PRN

## 2019-05-02 MED ORDER — PENICILLIN G POT IN DEXTROSE 60000 UNIT/ML IV SOLN
3.0000 10*6.[IU] | INTRAVENOUS | Status: DC
  Administered 2019-05-02 (×2): 3 10*6.[IU] via INTRAVENOUS
  Filled 2019-05-02 (×2): qty 50

## 2019-05-02 MED ORDER — FENTANYL-BUPIVACAINE-NACL 0.5-0.125-0.9 MG/250ML-% EP SOLN: 12.0000 mL/h | EPIDURAL | Status: DC | PRN

## 2019-05-02 NOTE — Progress Notes (Signed)
Labor Progress Note Regina Dawson is a 35 y.o. [phone] at [redacted]w[redacted]d presented for IOL d/t IUGR, gHTN. S: Patient resting comfortably  O:  BP 121/77   Pulse 85   Temp 98.3 F (36.8 C) (Oral)   Resp 18   Ht 5\' 4"  (1.626 m)   Wt 65.8 kg   LMP 08/16/2018   SpO2 99%   BMI 24.89 kg/m  EFM: 135 bpm/+accels/-decels  CVE: Dilation: 2 Effacement (%): 50 Station: -3 Presentation: Vertex Exam by:: Dr. [email or web address]   A&P: 34 y.o. 34 [redacted]w[redacted]d here for IOL d/t IUGR, gHTN #Labor: FB still in, traction placed, patient more uncomfortable with traction. Pitocin currently at 62mu/min.  #Pain: Per patient request #FWB: Cat I #GBS positive, PCN adequate #gHTN: On labetalol 100mg  BID, most recent BP normotensive, previously mild elevation this morning 130-140s/80-90s, currently asymptomatic.  4m, MD  [facility], PGY-1 1:09 PM

## 2019-05-02 NOTE — Discharge Instructions (Signed)

## 2019-05-02 NOTE — Progress Notes (Addendum)
Labor Progress Note Regina Dawson is a 34 y.o. [phone] at [redacted]w[redacted]d presented for IOL d/t IUGR, gHTN. S: Patient resting comfortably  O:  BP 128/79   Pulse 71   Temp 98.3 F (36.8 C) (Oral)   Resp 18   Ht 5\' 4"  (1.626 m)   Wt 65.8 kg   LMP 08/16/2018   SpO2 99%   BMI 24.89 kg/m  EFM: 130 bpm/+accels/-decels  CVE: Dilation: 5 Effacement (%): 80 Station: -2 Presentation: Vertex Exam by:: Dr. [email or web address]   A&P: 34 y.o. 34 [redacted]w[redacted]d here for IOL d/t IUGR, gHTN #Labor: Progressing well, FB removed, can go up on pitocin now. Bulging bag palpated. Anticipate VSD. Edited to add: AROM at 1820 for clear. #Pain: Per patient request #FWB: Cat I #GBS positive, PCN adequate #gHTN: On labetalol 100mg  BID, most recent BP normotensive, currently asymptomatic.  [redacted]w[redacted]d, MD  [facility], PGY-1 5:57 PM

## 2019-05-02 NOTE — H&P (Addendum)
OBSTETRIC ADMISSION HISTORY AND PHYSICAL  Regina Dawson is a 34 y.o. female [phone] with IUP at [redacted]w[redacted]d by LMP w early [place] according to patient presenting for IOL d/t gHTN, severe IUGR. She reports +FMs, No LOF, no VB, no blurry vision, headaches or peripheral edema, and RUQ pain.  She plans on bottle feeding. She requests an interval BTL for birth control, but would also like Depo to cover her post partum until she can be scheduled for surgery. 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, 5 VD before that cHTN, on labetalol, patient self d/c ASA  GBS positive Severe IUGR 4% Marginal cord insertion Elevated MSAFP 5.84 MoM, risk of OSBD 1:5 H/O PTD, between 32-34 weeks G1, G6, G7, rash with makena, switched to prometrium H/O 22 week demise, G5 H/O PEC with previous pregnancies H/O Bipolar disorder  Past Medical History: Past Medical History:  Diagnosis Date   Bipolar depression ([place])    Hx of preeclampsia, prior pregnancy, currently pregnant    Hypertension    Pre-eclampsia     Past Surgical History: Past Surgical History:  Procedure Laterality Date   CESAREAN SECTION     WISDOM TOOTH EXTRACTION      Obstetrical History: OB History     Gravida  8   Para  7   Term  3   Preterm  4   AB  0   Living  6      SAB  0   TAB      Ectopic      Multiple      Live Births  7           Social History: Social History   Socioeconomic History   Marital status: Single    Spouse name: Not on file   Number of children: Not on file   Years of education: Not on file   Highest education level: Not on  file  Occupational History   Not on file  Tobacco Use   Smoking status: Current Every Day Smoker    Packs/day: 0.50    Types: Cigarettes   Smokeless tobacco: Never Used  Substance and Sexual Activity   Alcohol use: Not Currently   Drug use: Not Currently   Sexual activity: Yes  Other Topics Concern   Not on file  Social History Narrative   Not on file   Social Determinants of Health   Financial Resource Strain:    Difficulty of Paying Living Expenses: Not on file  Food Insecurity:    Worried About Running Out of Food in the Last Year: Not on file   Ran Out of Food in the Last Year: Not on file  Transportation Needs:    Lack of Transportation (Medical): Not on file   Lack of Transportation (Non-Medical): Not on file  Physical Activity:    Days of Exercise per Week: Not on file   Minutes of Exercise per Session: Not on file  Stress:    Feeling of Stress : Not on file  Social Connections:    Frequency of Communication with Friends and Family: Not on file   Frequency of Social  Gatherings with Friends and Family: Not on file   Attends Religious Services: Not on file   Active Member of Clubs or Organizations: Not on file   Attends Banker Meetings: Not on file   Marital Status: Not on file    Family History: History reviewed. No pertinent family history.  Allergies: Allergies  Allergen Reactions   Makena [Hydroxyprogesterone Caproate] Hives    Medications Prior to Admission  Medication Sig Dispense Refill Last Dose   chlorproMAZINE (THORAZINE) 25 MG tablet Take 25 mg by mouth 2 (two) times daily.      cyclobenzaprine (FLEXERIL) 10 MG tablet Take 10 mg by mouth 3 (three) times daily as needed for muscle spasms.      Doxylamine-Pyridoxine (DICLEGIS) 10-10 MG TBEC Take by mouth.      labetalol (NORMODYNE) 100 MG tablet Take 100 mg by mouth 2 (two) times daily.      omeprazole (PRILOSEC) 20 MG capsule Take 40 mg by mouth daily.       ondansetron  (ZOFRAN-ODT) 4 MG disintegrating tablet Take 4 mg by mouth every 8 (eight) hours as needed for nausea or vomiting.      potassium chloride SA (KLOR-CON) 20 MEQ tablet Take 1 tablet (20 mEq total) by mouth 2 (two) times daily for 7 days. 14 tablet 0    Prenat w/o A FeCbnFeGlu-FA &B6 (CITRANATAL B-CALM) 20-1 MG & 2 x 25 MG MISC Take by mouth.      Promethazine HCl (PHENERGAN PO) Take by mouth.        Review of Systems   All systems reviewed and negative except as stated in HPI  Last menstrual period 08/16/2018. General appearance: alert, cooperative, appears stated age and no distress Lungs: normal effort Heart: regular rate  Abdomen: soft, non-tender; bowel sounds normal Pelvic: gravid uterus GU: No vaginal lesions  Extremities: Homans sign is negative, no sign of DVT DTR's intact Presentation: cephalic Fetal monitoringBaseline: 140 bpm, Variability: Good {> 6 bpm), Accelerations: Reactive and Decelerations: Absent Uterine activity: Frequency: Every irregular q4-5 minutes    Prenatal labs: ABO, Rh: O/Positive/-- (07/13 0000) Antibody: Negative (07/13 0000) Rubella: Immune (07/13 0000) RPR: Nonreactive (07/13 0000)  HBsAg: Negative (07/13 0000)  HIV: Non-reactive (07/13 0000)  GBS: --Regina Dawson (01/14 1049)  1 hr Glucola 177 Genetic screening  MSAFP 5.84 MoM, declined genetic counseling Anatomy US WNL  Prenatal Transfer Tool  Maternal Diabetes: No Genetic Screening: Abnormal:  Results: Elevated AFP Maternal Ultrasounds/Referrals: IUGR Fetal Ultrasounds or other Referrals:  Referred to Materal Fetal Medicine  Maternal Substance Abuse:  No Significant Maternal Medications:  Meds include: Other:  labetalol Significant Maternal Lab Results: Group B Strep positive  No results found for this or any previous visit (from the past 24 hour(s)).  Patient Active Problem List   Diagnosis Date Noted   Labor and delivery, indication for care 05/02/2019   History of preterm delivery  04/17/2019   History of cesarean delivery affecting pregnancy 04/17/2019   IUGR (intrauterine growth restriction) affecting care of mother 04/17/2019   Hx of preeclampsia, prior pregnancy, currently pregnant    Gestational hypertension, antepartum    Supervision of high risk pregnancy, antepartum 04/09/2019    Assessment/Plan:  MARDELLE PANDOLFI is a 35 y.o. [id number] at [redacted]w[redacted]d here for IOL d/t gHTN, severe IUGR.  #Labor: Risks and benefits of induction were reviewed, including failure of method, prolonged labor, need for further intervention, risk of cesarean.  Patient and family seem to understand these risks and wish  to proceed. Options of cytotec, foley bulb, AROM, and pitocin reviewed, with use of each discussed. Foley bulb placed, low dose pitocin started. TOLAC. Consider AROM when appropriate. #gHTN: On labetalol 100 mg BID, BP 130s/80s so far, will continue to monitor. Cr, AST, ALT, plt all WNL. P:C pending #Pain: Per patient request #FWB:  Cat I; EFW: 2500g #ID:  GBS positive, PCN #MOF: bottle #MOC: interval BTL, signed 04/17/19. Depo for coverage #Circ:  Yes, outpatient  Shirlean Mylar, MD [facility], PGY-1 05/02/2019, 7:48 AM   GME ATTESTATION:  I saw and evaluated the patient. I agree with the findings and the plan of care as documented in the resident's note.  Marlowe Alt, DO OB Fellow, Faculty [facility], Center for [facility] 05/02/2019 10:40 AM

## 2019-05-02 NOTE — Discharge Summary (Signed)
Postpartum Discharge Summary     Patient Name: Regina Dawson DOB: [birth year] MRN: [id number]  Date of admission: 05/02/2019 Delivering Provider: Merilyn Baba   Date of discharge: 05/04/2019  Admitting diagnosis: Labor and delivery, indication for care [O75.9] Intrauterine pregnancy: [redacted]w[redacted]d    Secondary diagnosis:  Active Problems:   History of preterm delivery   History of cesarean delivery affecting pregnancy   Hx of preeclampsia, prior pregnancy, currently pregnant   Gestational hypertension, antepartum   IUGR (intrauterine growth restriction) affecting care of mother   Labor and delivery, indication for care  Additional problems: Severe IUGR, 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 yo [id number] admitted to the hospital 05/02/2019 for induction of labor.  Indication for induction: severe IUGR.  Patient had an uncomplicated labor course as follows:  Patient's induction started with FB and low dose pitocin. She progressed to 5 cm and was AROMed, delivery shortly after.  Membrane Rupture Time/Date: 6:20 PM ,05/02/2019   Intrapartum Procedures: Episiotomy: None [1]                                         Lacerations:  None [1]  Patient had delivery of a Viable infant.  Information for the patient's newborn:  HaDomonic, Hiscox0[[id number]]Delivery Method: VBAC, Spontaneous(Filed from Delivery Summary)    05/02/2019  Details of delivery can be found in separate delivery note. Patient transitioned from Labetalol to Norvasc 5 mg for gHTN vs cHTN. Norvasc prescribed  on DC and patient to have BP check. Desired Depo prior to DC and then will scheduled interval BTL. Patient had a routine postpartum course. Patient is discharged home 05/04/19. Delivery time: 6:38 PM    Magnesium Sulfate received: No BMZ received: No Rhophylac:N/A MMR:N/A Transfusion:No  Physical exam  Vitals:   05/03/19 1050 05/03/19 2218 05/04/19 0526 05/04/19 0645  BP: 130/72 117/81 (!) 149/100 135/82  Pulse: 92 93 85   Resp: '16 18 17   ' Temp: 98.7 F (37.1 C) 98.5 F (36.9 C) 98.4 F (36.9 C)   TempSrc: Oral Oral Oral   SpO2: 99%  100%   Weight:      Height:       General: alert, cooperative and no distress Lochia: appropriate Uterine Fundus: firm Incision: N/A DVT Evaluation: No evidence of DVT seen on physical exam. Labs: Lab Results  Component Value Date   WBC 7.9 05/03/2019   HGB 10.5 (L) 05/03/2019   HCT 32.1 (L) 05/03/2019   MCV 90.4 05/03/2019  PLT 176 05/03/2019   CMP Latest Ref Rng & Units 04/18/2019  Glucose 70 - 99 mg/dL 76  BUN 6 - 20 mg/dL <5(L)  Creatinine 0.44 - 1.00 mg/dL 0.61  Sodium 135 - 145 mmol/L 134(L)  Potassium 3.5 - 5.1 mmol/L 2.9(L)  Chloride 98 - 111 mmol/L 103  CO2 22 - 32 mmol/L 19(L)  Calcium 8.9 - 10.3 mg/dL 8.9  Total Protein 6.5 - 8.1 g/dL 6.8  Total Bilirubin 0.3 - 1.2 mg/dL 0.7  Alkaline Phos 38 - 126 U/L 304(H)  AST 15 - 41 U/L 26  ALT 0 - 44 U/L 25    Discharge instruction: per After Visit Summary and "Baby and Me Booklet".  After visit meds:  Allergies as of 05/04/2019      Reactions   Makena [hydroxyprogesterone Caproate] Hives      Medication List    STOP taking these medications   labetalol 100 MG tablet Commonly known as: NORMODYNE   PHENERGAN PO   potassium chloride SA 20 MEQ tablet Commonly known as: KLOR-CON     TAKE these medications   acetaminophen 325 MG tablet Commonly known as: Tylenol Take 2 tablets (650 mg total) by mouth every 6 (six) hours as needed (for pain scale < 4).    amLODipine 5 MG tablet Commonly known as: NORVASC Take 1 tablet (5 mg total) by mouth daily.   CitraNatal B-Calm 20-1 MG & 2 x 25 MG Misc Take 1 tablet by mouth daily.   cyclobenzaprine 10 MG tablet Commonly known as: FLEXERIL Take 10 mg by mouth 3 (three) times daily as needed for muscle spasms.   ibuprofen 600 MG tablet Commonly known as: ADVIL Take 1 tablet (600 mg total) by mouth every 6 (six) hours.   omeprazole 40 MG capsule Commonly known as: PRILOSEC Take 40 mg by mouth daily.       Diet: routine diet  Activity: Advance as tolerated. Pelvic rest for 6 weeks.   Outpatient follow up:4 weeks Follow up Appt:No future appointments. Follow up Visit: Please schedule this patient for Postpartum visit in: 4 weeks with the following provider: Any provider For C/S patients schedule nurse incision check in weeks 2 weeks: no High risk pregnancy complicated by: severe IUGR, cHTN on labetalol, TOLAC, grand multip, hx of 22 wk IUFD Delivery mode:  SVD Anticipated Birth Control:  Plans Interval BTL (needs consult with MD ASAP- consent signed 04/17/19), depo until able to be shceduled PP Procedures needed: BP check  Schedule Integrated [place] visit: no  Newborn Data: Live born female  Birth Weight: 4 lb 7.4 oz (2025 g) APGAR (1 MIN): 6   APGAR (5 MINS): 9   APGAR (10 MINS):    Newborn Delivery   Birth date/time: 05/02/2019 18:38:00 Delivery type: VBAC, Spontaneous      Baby Feeding: Bottle and Breast Disposition:home vs rooming-in   05/04/2019 Chauncey Mann, MD

## 2019-05-03 LAB — CBC
HCT: 32.1 % — ABNORMAL LOW (ref 36.0–46.0)
Hemoglobin: 10.5 g/dL — ABNORMAL LOW (ref 12.0–15.0)
MCH: 29.6 pg (ref 26.0–34.0)
MCHC: 32.7 g/dL (ref 30.0–36.0)
MCV: 90.4 fL (ref 80.0–100.0)
Platelets: 176 10*3/uL (ref 150–400)
RBC: 3.55 MIL/uL — ABNORMAL LOW (ref 3.87–5.11)
RDW: 13.1 % (ref 11.5–15.5)
WBC: 7.9 10*3/uL (ref 4.0–10.5)
nRBC: 0 % (ref 0.0–0.2)

## 2019-05-03 LAB — ABO/RH: ABO/RH(D): O POS

## 2019-05-03 MED ORDER — AMLODIPINE BESYLATE 5 MG PO TABS
5.0000 mg | ORAL_TABLET | Freq: Every day | ORAL | Status: DC
  Administered 2019-05-03 – 2019-05-04 (×2): 5 mg via ORAL
  Filled 2019-05-03 (×2): qty 1

## 2019-05-03 NOTE — Progress Notes (Addendum)
POSTPARTUM PROGRESS NOTE  Post Partum Day 1  Subjective:  MODELLE VOLLMER is a 34 y.o. [id number] s/p VBAC at [redacted]w[redacted]d.  She reports she is doing well. No acute events overnight. She denies any problems with ambulating, voiding or po intake. Denies nausea or vomiting.  Pain is well controlled, but reports continued uterine cramping w/ only light vaginal bleeding.  Lochia is appropriate.  Objective: Blood pressure 113/67, pulse 86, temperature 98.4 F (36.9 C), temperature source Oral, resp. rate 16, height 5\' 4"  (1.626 m), weight 65.8 kg, last menstrual period 08/16/2018, SpO2 100 %, unknown if currently breastfeeding.  Physical Exam:  General: alert, cooperative and no distress Chest: no respiratory distress Heart:regular rate, distal pulses intact Abdomen: soft, nontender Uterine Fundus: firm, appropriately tender DVT Evaluation: No calf swelling or tenderness Extremities: no edema Skin: warm, dry  Recent Labs    05/02/19 0740  HGB 13.0  HCT 39.0    Assessment/Plan: AMAREA MACDOWELL is a 34 y.o. 34 s/p VBAC at [redacted]w[redacted]d   PPD#1 - Doing well s/p VBAC @ 1838 on 1/29. Continue routine postpartum care.  Contraception: Depo (ordered) w/ interval tubal (consent 04/16/2018) Feeding: both cHTN: switched from Labetalol to 5mg  Norvasc today. BPs appear much improved since delivery. Continue to monitor BPs, and plan to discharge w/ Norvasc. Dispo: Plan for discharge tomorrow.   LOS: 1 day    04/18/2018, DO, PGY-1 OBGYN Faculty Teaching Service  05/03/2019, 3:31 AM   I saw and evaluated the patient. I agree with the findings and the plan of care as documented in the resident's note. As patient desires permanent sterilization, Norvasc 5 mg ordered in exchange for Labetalol for cHTN; may need to further titrate Norvasc.   Lorri Frederick, MD [facility] Family Medicine Fellow, [facility] for [facility], [facility]

## 2019-05-03 NOTE — Lactation Note (Addendum)
This note was copied from a baby's chart. Lactation Consultation Note  Patient Name: Regina Dawson HOOIL'N Date: 05/03/2019 Reason for consult: Initial assessment;MD order;Infant < 6lbs;Early term 37-38.6wks P7, 7 hour female , ETI infant, IUGR, DAT+, triple photo theraphy and less than 5 lbs at birth. Mom's hx: smoked 10 cigarettes per day in pregnancy, GHTN, bi-polar with depression Mom is active on the [facility] in [facility] ( [place]) she doesn't have DEBP at home, [facility] referral sent. LC gave mom hand pump. Mom's feeding choice at admission was breast and formula feeding. Infant has been formula feed twice since birth on Similac Neosure 22 kcal with iron. Per mom, she is concern infant has been spiting up formula and her other children were all on soy base formula.  LC alert RN about mom's concerns and wanting infant to have soy based formula. Per mom, she wants to pump and formula feed infant, she doesn't want to latch infant at breast. LC discussed hand expression and mom taught back infant was given 5 mls of colostrum by spoon and 5 mls by purple slow flow bottle nipple a total of 10 mls. Mom pumped 12 mls with DEBP. LC discussed LPTI policy with mom green sheet given. Mom knows to feed infant  according to cues, 8 to 12 times with 24 hours and not exceed 3 hours without feeding infant. Mom plans to pump every 3 hours for 15 minutes on initial setting giving back any EBM first and then supplementing with formula every feeding.  Mom shown how to use DEBP & how to disassemble, clean, & reassemble parts. Mom knows to call RN or LC if she has any questions, concerns or if she decides to latch infant at breast.  Maternal Data Formula Feeding for Exclusion: Yes Reason for exclusion: Mother's choice to formula and breast feed on admission Has patient been taught Hand Expression?: Yes Does the patient have breastfeeding experience prior to this delivery?: Yes  Feeding Feeding  Type: Bottle Fed - Formula Nipple Type: Slow - flow  LATCH Score                   Interventions Interventions: Breast feeding basics reviewed;DEBP;Hand pump;Hand express;Expressed milk  Lactation Tools Discussed/Used WIC Program: Yes Pump Review: Setup, frequency, and cleaning;Milk Storage Initiated by:: Danelle Earthly, IBCLC Date initiated:: 05/03/19   Consult Status Consult Status: Follow-up Date: 05/03/19 Follow-up type: In-patient    Danelle Earthly 05/03/2019, 2:13 AM

## 2019-05-03 NOTE — Lactation Note (Signed)
This note was copied from a baby's chart. Lactation Consultation Note  Patient Name: Boy Irene Collings NOMVE'H Date: 05/03/2019 Reason for consult: Follow-up assessment;Infant < 6lbs;Other (Comment);Early term 37-38.6wks(DAT (+))  Spoke to [employer], she reported to [facility] that mom has switched to formula, she hasn't been pumping since the pump was set up and when RN approached mom to ask about the pump she replied: "I forgot how painful this was", she has decided to do formula at the hospital, but lactation will check with her prior her discharge to make sure she doesn't have any further questions or concerns; baby is an ETI < 6 lbs, mom is a P7.  Baby is still on Triple phototherapy and has been going back and forth between Similac and Enfamil formulas 20-22 calorie count due to mother's preference to soy formula (which is not available at the hospital at this point). Serum bilirubin is finally going down and it's at 4.6 ml/dL. RN to call lactation in case mom changes her mind and/or she decides to see lactation prior leaving the hospital.  Mom's original plan was to pump and bottle feed, no LATCH score recorded in chart either. RN notified regarding lactation follow and will page LC if needed.   Maternal Data    Feeding Feeding Type: Bottle Fed - Formula  LATCH Score                   Interventions    Lactation Tools Discussed/Used     Consult Status Consult Status: PRN Date: 05/04/19 Follow-up type: In-patient    Takiya Belmares Venetia Constable 05/03/2019, 1:20 PM

## 2019-05-03 NOTE — Clinical Social Work Maternal (Addendum)
CLINICAL SOCIAL WORK MATERNAL/CHILD NOTE  Patient Details  Name: Regina Dawson MRN: [id number] Date of Birth: [birth year]  Date:  05/03/2019  Clinical Social Worker Initiating Note:  Elijio Miles Date/Time: Initiated:  05/03/19/1202     Child's Name:  Regina Dawson   Biological Parents:  Mother, Father(Yamaira Kingsbury and Wille Glaser DOB: November 25, 1986)   Need for Interpreter:  None   Reason for Referral:  Behavioral Health Concerns   Address:  [address] [address]    Phone number:  [phone] (home)     Additional phone number:   Household Members/Support Persons (HM/SP):   Household Member/Support Person 1, Household Member/Support Person 2, Household Member/Support Person 3, Household Member/Support Person 4, Household Member/Support Person 5, Household Member/Support Person 6, Household Member/Support Person 7   HM/SP Name Relationship DOB or Age  HM/SP -1 Wille Glaser FOB 07/04/1996  HM/SP -2 Martinique Memon Son 12/02/2003  HM/SP -3 Shirlyn Goltz Son 07/21/2006  HM/SP -4 Renise Quentin Cornwall Daughter 08/28/2007  HM/SP -5 Anastasio Auerbach Daughter 06/22/2009  HM/SP -6 [name] Legrand Como Daughter 03/22/2011  HM/SP -7 A'mya Michael Daughter 07/15/2013  HM/SP -8          Natural Supports (not living in the home):  Extended Family, Immediate Family   Professional Supports: None   Employment: Unemployed   Type of Work:     Education:  [place] arranged:    Museum/gallery curator Resources:  Medicaid   Other Resources:  Physicist, medical , [place] Considerations Which May Impact Care:    Strengths:  Ability to meet basic needs , Engineer, materials, Home prepared for child    Psychotropic Medications:         Pediatrician:    [place]olicitor area  Pediatrician List:   [employer] Other([facility])  [place]      Pediatrician  Fax Number:    Risk Factors/Current Problems:      Cognitive State:  Able to Concentrate , Alert , Linear Thinking    Mood/Affect:  Calm , Comfortable , Interested , Relaxed    CSW Assessment:  CSW received consult for bipolar depression.  CSW met with MOB to offer support and complete assessment.    MOB sitting up in bed with infant asleep in bassinet and under phototherapy lights and FOB present at bedside, when CSW entered the room. FOB noted to be talking to someone on speaker phone. CSW offered to return at a later time but MOB welcoming of [place] visit and stated "it's all family" and was informed person on the phone was her brother-in-law. CSW received verbal permission to discuss anything with FOB present and speaking on the phone with her brother-in-law. MOB pleasant and engaged throughout assessment. MOB reported she and FOB live together with MOB's six children. MOB confirmed she receives both [facility] and food stamps and was encouraged to reach out to get infant added to plans. CSW inquired about MOB's mental health history and MOB acknowledged being diagnosed with bipolar depression in 2012 or 2013. MOB denied any recent mental health concerns or symptoms. MOB reported feeling "good" now. MOB reported a history of being on medications and receiving counseling for her mental health but denied any interest or need for either, at this time. MOB also denied any previous PPD/A. CSW provided education regarding the baby blues period vs. perinatal mood disorders,  discussed treatment and gave resources for mental health follow up if concerns arise. CSW recommended self-evaluation during the postpartum time period using the New Mom Checklist from Postpartum Progress and encouraged MOB to contact a medical professional if symptoms are noted at any time. MOB did not appear to be displaying any acute mental health symptoms and denied any current SI or HI. MOB reported having good support from FOB, her  sister and her brother-in-law.  MOB confirmed having all essential items for infant once discharged and stated infant would be sleeping in a bassinet once home. CSW provided review of Sudden Infant Death Syndrome (SIDS) precautions and safe sleeping habits.    CSW Plan/Description:  No Further Intervention Required/No Barriers to Discharge, Sudden Infant Death Syndrome (SIDS) Education, Perinatal Mood and Anxiety Disorder (PMADs) Education    [place], LCSW 05/03/2019, 12:55 PM

## 2019-05-04 MED ORDER — MEDROXYPROGESTERONE ACETATE 150 MG/ML IM SUSP
150.0000 mg | INTRAMUSCULAR | Status: AC | PRN
  Administered 2019-05-04: 150 mg via INTRAMUSCULAR
  Filled 2019-05-04: qty 1

## 2019-05-04 MED ORDER — ACETAMINOPHEN 325 MG PO TABS: 650.0000 mg | ORAL_TABLET | Freq: Four times a day (QID) | ORAL | 0 refills | Status: AC | PRN

## 2019-05-04 MED ORDER — IBUPROFEN 600 MG PO TABS: 600.0000 mg | ORAL_TABLET | Freq: Four times a day (QID) | ORAL | 0 refills | Status: AC

## 2019-05-04 MED ORDER — AMLODIPINE BESYLATE 5 MG PO TABS: 5.0000 mg | ORAL_TABLET | Freq: Every day | ORAL | 0 refills | Status: AC

## 2019-05-06 LAB — SURGICAL PATHOLOGY

## 2019-05-14 ENCOUNTER — Ambulatory Visit (INDEPENDENT_AMBULATORY_CARE_PROVIDER_SITE_OTHER): Payer: Medicaid Other | Admitting: *Deleted

## 2019-05-14 ENCOUNTER — Other Ambulatory Visit: Payer: Self-pay

## 2019-05-14 VITALS — BP 135/86 | HR 115 | Ht 64.0 in | Wt 134.1 lb

## 2019-05-14 DIAGNOSIS — Z0130 Encounter for examination of blood pressure without abnormal findings: Secondary | ICD-10-CM

## 2019-05-14 NOTE — Progress Notes (Addendum)
Pt here for BP check S/P vaginal delivery on 05/02/19. BP 135/86. Pt denies H/A or visual disturbances. Consult w/Terri Marvetta Gibbons and pt was advised to continue current dose of Amlodipine. She was reminded to go to [facility] if she develops H/A or visual disturbances. She will keep scheduled appt on 2/24 for PP care. Pt voiced understanding and agreed to plan of care.    Chart reviewed for nurse visit. Agree with plan of care.   Currie Paris, NP 05/14/2019 4:54 PM

## 2019-05-19 ENCOUNTER — Encounter: Payer: Self-pay | Admitting: General Practice

## 2019-05-20 ENCOUNTER — Encounter: Payer: Self-pay | Admitting: General Practice

## 2019-05-28 ENCOUNTER — Ambulatory Visit (INDEPENDENT_AMBULATORY_CARE_PROVIDER_SITE_OTHER): Payer: Medicaid Other | Admitting: Obstetrics & Gynecology

## 2019-05-28 ENCOUNTER — Other Ambulatory Visit: Payer: Self-pay

## 2019-05-28 ENCOUNTER — Encounter: Payer: Self-pay | Admitting: Obstetrics & Gynecology

## 2019-05-28 NOTE — Patient Instructions (Signed)

## 2019-05-28 NOTE — Progress Notes (Signed)
Subjective:     Regina Dawson is a 34 y.o. female who presents for a postpartum visit. She is 4 weeks postpartum following a spontaneous vaginal delivery. I have fully reviewed the prenatal and intrapartum course.good The delivery was at 37 gestational weeks. Outcome: spontaneous vaginal delivery. Anesthesia: none. Postpartum course has been good. Baby's course has been good. Baby is feeding by bottle Rush Barer Smooth . Bleeding staining only. Bowel function is normal. Bladder function is normal. Patient is not sexually active. Contraception method is Depo-Provera injections. Postpartum depression screening: negative. DMPA was given in the hospital The following portions of the patient's history were reviewed and updated as appropriate: allergies, current medications, past family history, past medical history, past social history, past surgical history and problem list.  Review of Systems Pertinent items are noted in HPI.   Objective:    There were no vitals taken for this visit.  General:  alert, cooperative and no distress           Abdomen: soft, non-tender; bowel sounds normal; no masses,  no organomegaly   Vulva:  not evaluated  Vagina: not evaluated                    Assessment:     normal postpartum exam. Pap smear not done at today's visit.   Plan:    1. Contraception: Depo-Provera injections 2. Moving to [place],needs DMPA in 2 months 3. Follow up as needed.     Adam Phenix, MD 05/28/2019

## 2019-06-25 DIAGNOSIS — N921 Excessive and frequent menstruation with irregular cycle: Secondary | ICD-10-CM

## 2019-06-26 MED ORDER — NORGESTIMATE-ETH ESTRADIOL 0.25-35 MG-MCG PO TABS: 1.0000 | ORAL_TABLET | Freq: Every day | ORAL | 0 refills | Status: AC
